# Patient Record
Sex: Male | Born: 1935 | Race: White | Hispanic: No | Marital: Married | Smoking: Never smoker
Health system: Southern US, Community
[De-identification: ages and names within clinical notes are randomized; demographics above are authoritative.]

---

## 2012-12-07 DIAGNOSIS — C4491 Basal cell carcinoma of skin, unspecified: Secondary | ICD-10-CM

## 2012-12-07 HISTORY — DX: Basal cell carcinoma of skin, unspecified: C44.91

## 2015-12-08 DIAGNOSIS — L570 Actinic keratosis: Secondary | ICD-10-CM

## 2015-12-08 HISTORY — DX: Actinic keratosis: L57.0

## 2018-04-25 ENCOUNTER — Other Ambulatory Visit: Payer: Self-pay | Admitting: Internal Medicine

## 2018-04-25 DIAGNOSIS — I714 Abdominal aortic aneurysm, without rupture, unspecified: Secondary | ICD-10-CM

## 2018-05-10 ENCOUNTER — Ambulatory Visit
Admission: RE | Admit: 2018-05-10 | Discharge: 2018-05-10 | Disposition: A | Discharge status: Home / Self Care | Payer: Medicare Other | Source: Ambulatory Visit | Attending: Internal Medicine | Admitting: Internal Medicine

## 2018-05-10 DIAGNOSIS — I714 Abdominal aortic aneurysm, without rupture, unspecified: Secondary | ICD-10-CM

## 2019-09-08 IMAGING — US US AORTA SCREENING (MEDICARE)
1 series · 14 of 20 positions shown · non-contrast
Comparison: None available

CLINICAL DATA: Abdominal aortic aneurysm demonstrated by x-ray

EXAM:
ULTRASOUND OF ABDOMINAL AORTA
TECHNIQUE: Ultrasound examination of the abdominal aorta was performed to
evaluate for abdominal aortic aneurysm.

[Series 1: us aorta screening (medicare) · 0.31mm/px · 14 of 20 slices shown]
[im 1/20]
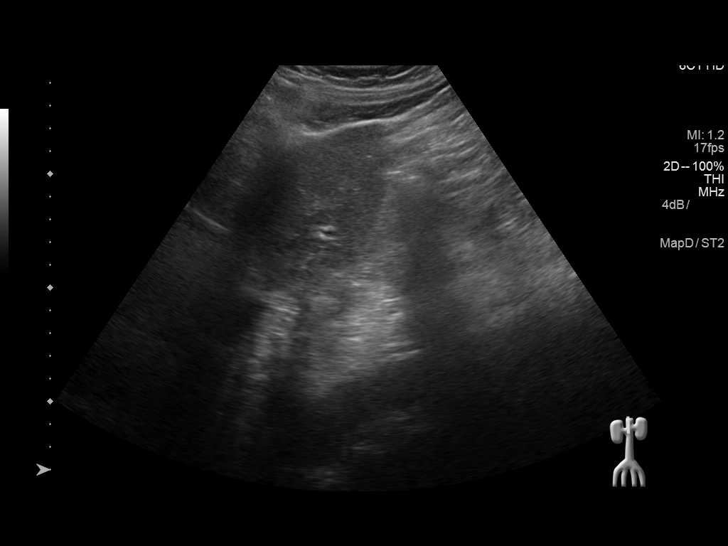
[im 3/20]
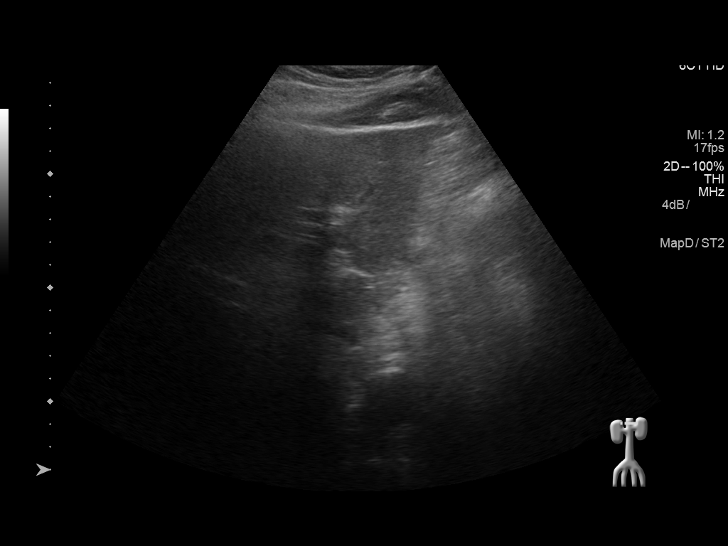
[im 4/20]
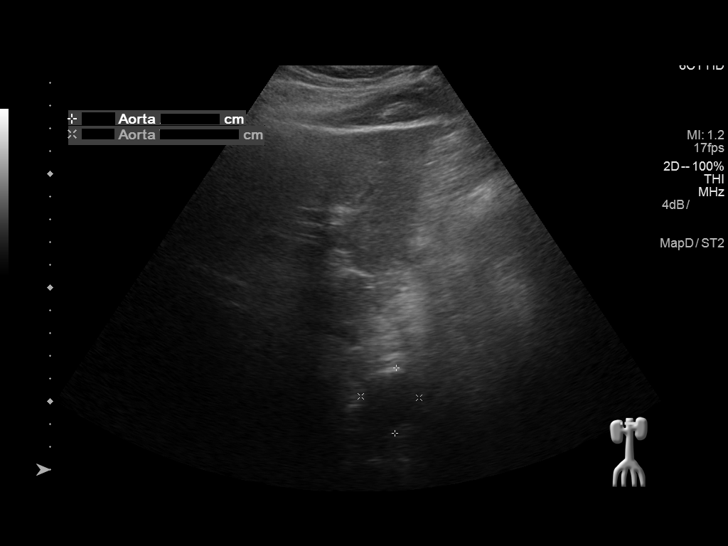
[im 6/20]
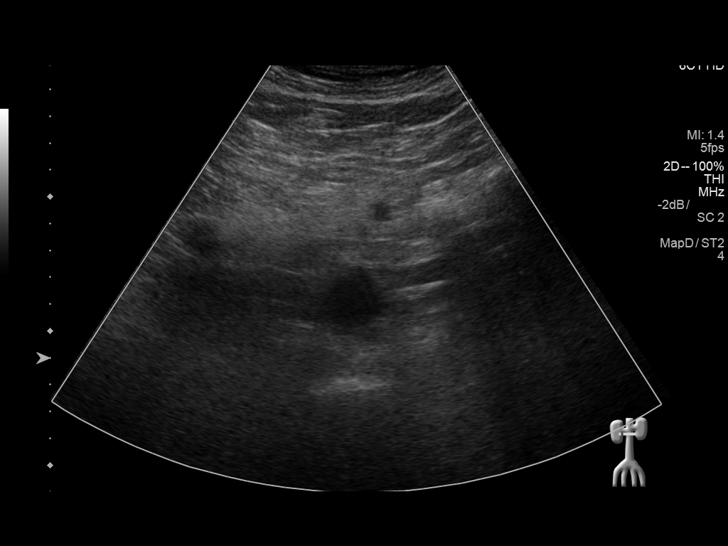
[im 7/20]
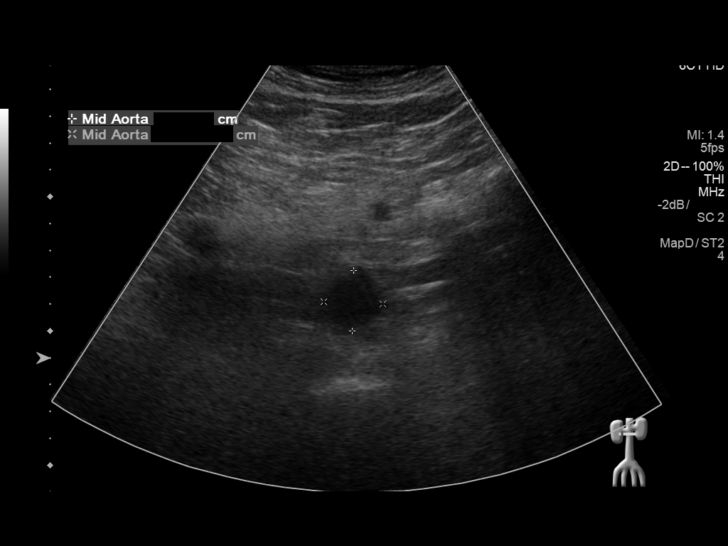
[im 8/20]
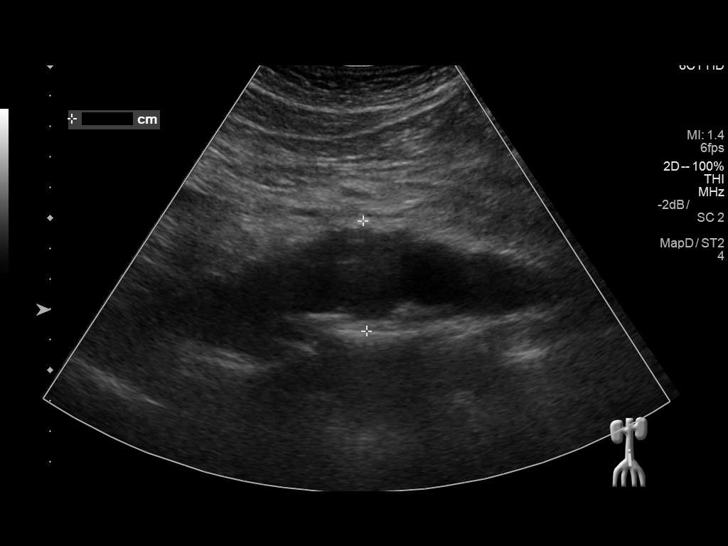
[im 10/20]
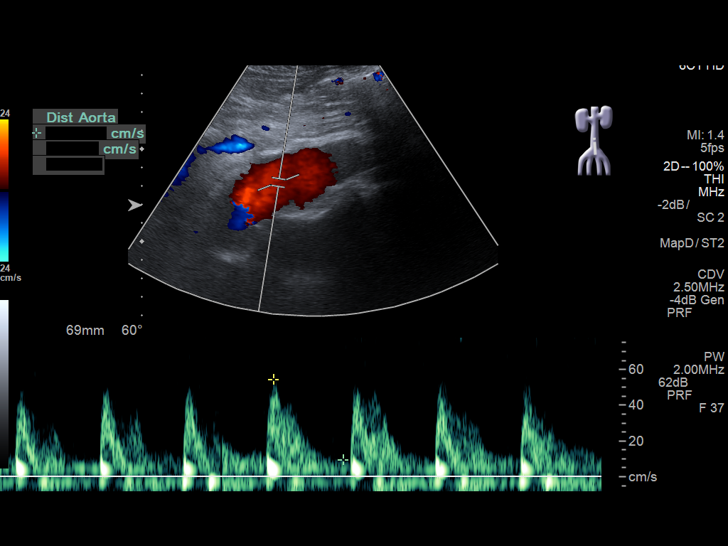
[im 11/20]
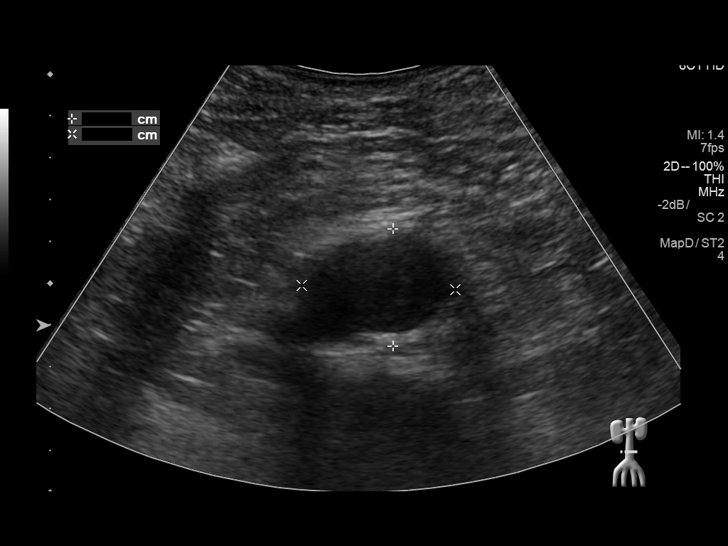
[im 13/20]
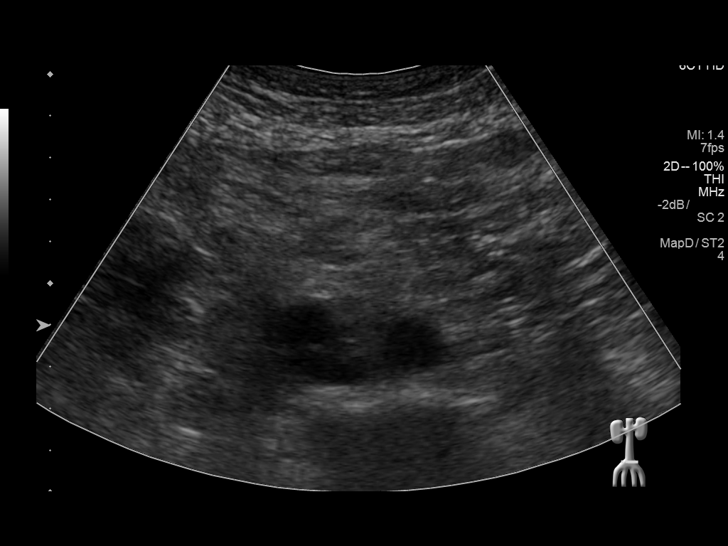
[im 14/20]
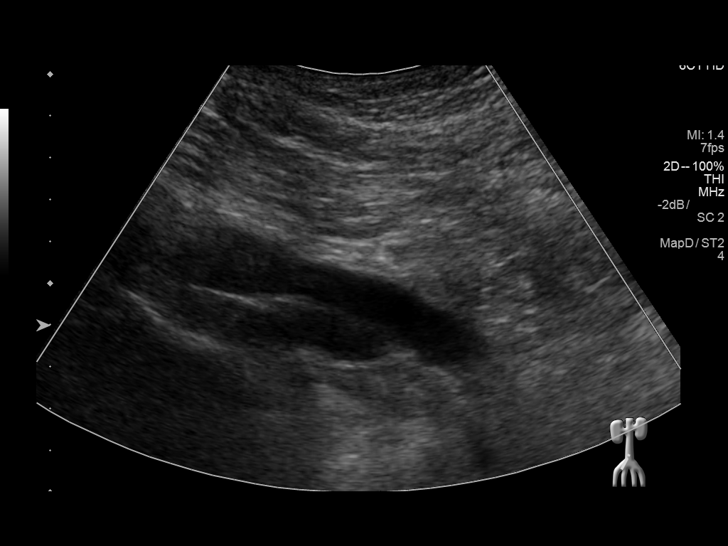
[im 16/20]
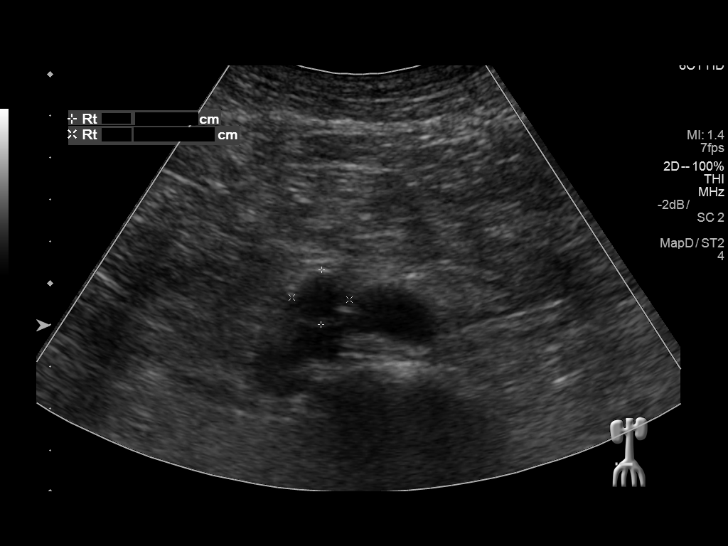
[im 17/20]
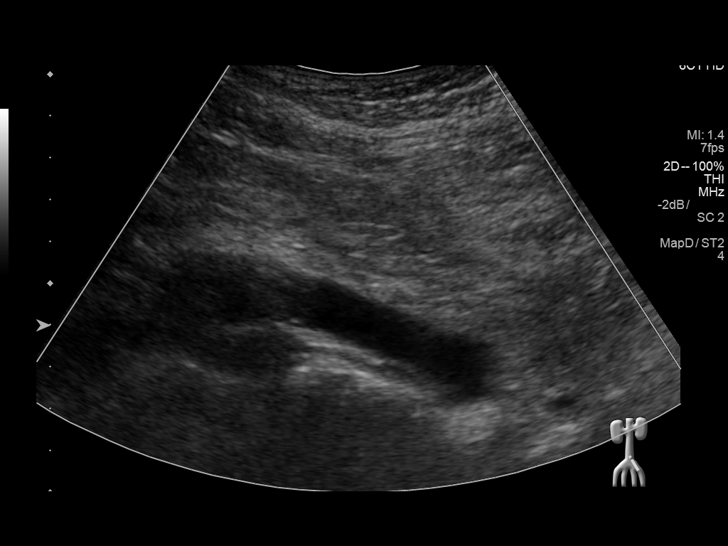
[im 18/20]
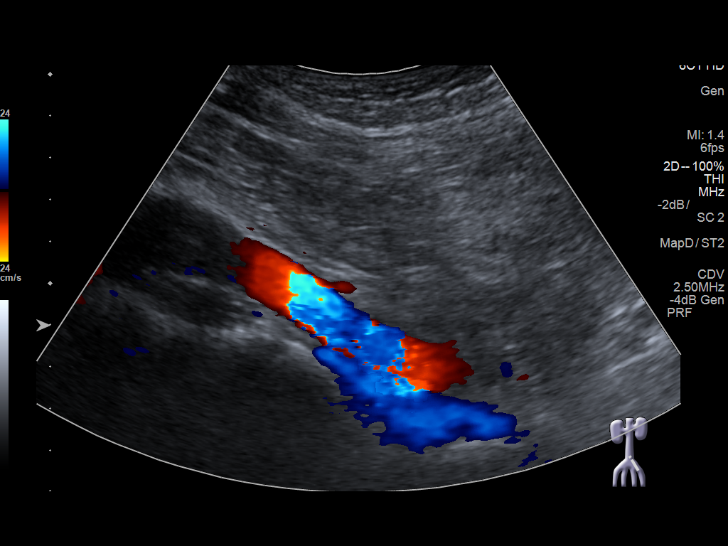
[im 20/20]
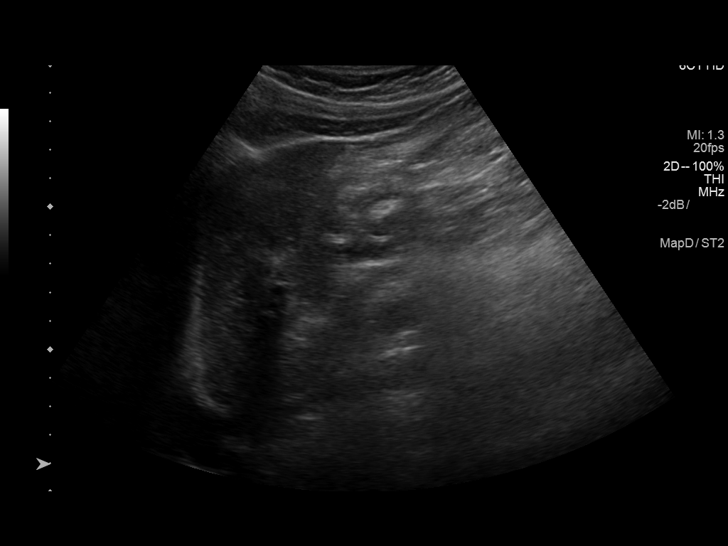

[14 of 20 positions shown; findings below may reference images not displayed]

FINDINGS: Abdominal aortic measurements as follows:

Proximal:  2.9 cm

Mid:  2.3 cm

Distal:  3.7 cm

Right iliac: 1.4 cm

Left iliac: 1.4 cm
IMPRESSION: 3.7 cm infrarenal distal aorta fusiform aneurysm

Recommend followup by ultrasound in 2 years. This recommendation
follows ACR consensus guidelines: White Paper of the ACR [employer]

## 2020-05-21 ENCOUNTER — Telehealth: Payer: Self-pay | Admitting: Radiology

## 2020-05-21 NOTE — Telephone Encounter (Signed)
Received the incorrect order on 3-4 for u/s abdominal aorta.  The office has been emailed 3x to fax the corrected order with no response.  The incorrect order has been purged from the main fax que.

## 2021-07-16 ENCOUNTER — Other Ambulatory Visit: Payer: Self-pay

## 2021-07-16 ENCOUNTER — Ambulatory Visit (INDEPENDENT_AMBULATORY_CARE_PROVIDER_SITE_OTHER): Payer: Medicare Other | Admitting: Dermatology

## 2021-07-16 DIAGNOSIS — L821 Other seborrheic keratosis: Secondary | ICD-10-CM | POA: Diagnosis not present

## 2021-07-16 DIAGNOSIS — L578 Other skin changes due to chronic exposure to nonionizing radiation: Secondary | ICD-10-CM

## 2021-07-16 DIAGNOSIS — Z1283 Encounter for screening for malignant neoplasm of skin: Secondary | ICD-10-CM | POA: Diagnosis not present

## 2021-07-16 DIAGNOSIS — L570 Actinic keratosis: Secondary | ICD-10-CM

## 2021-07-16 DIAGNOSIS — L820 Inflamed seborrheic keratosis: Secondary | ICD-10-CM

## 2021-07-16 DIAGNOSIS — L814 Other melanin hyperpigmentation: Secondary | ICD-10-CM

## 2021-07-16 DIAGNOSIS — D2339 Other benign neoplasm of skin of other parts of face: Secondary | ICD-10-CM | POA: Diagnosis not present

## 2021-07-16 DIAGNOSIS — D1800 Hemangioma unspecified site: Secondary | ICD-10-CM

## 2021-07-16 DIAGNOSIS — D485 Neoplasm of uncertain behavior of skin: Secondary | ICD-10-CM

## 2021-07-16 DIAGNOSIS — D229 Melanocytic nevi, unspecified: Secondary | ICD-10-CM

## 2021-07-16 DIAGNOSIS — D239 Other benign neoplasm of skin, unspecified: Secondary | ICD-10-CM

## 2021-07-16 HISTORY — DX: Other benign neoplasm of skin, unspecified: D23.9

## 2021-07-16 NOTE — Progress Notes (Signed)
Follow-Up Visit   Subjective  Cameron Norton is a 87 y.o. male who presents for the following: UBSE (Hx AK's and ISK's ). Lesion on the L cheek - irregular, has been there for many years. The patient presents for Upper Body Skin Exam (UBSE) for skin cancer screening and mole check.  The following portions of the chart were reviewed this encounter and updated as appropriate:   Allergies  Meds  Problems  Med Hx  Surg Hx  Fam Hx     Review of Systems:  No other skin or systemic complaints except as noted in HPI or Assessment and Plan.  Objective  Well appearing patient in no apparent distress; mood and affect are within normal limits.  A focused examination was performed including all skin from the waist up. Relevant physical exam findings are noted in the Assessment and Plan.   Assessment & Plan  AK (actinic keratosis) (17) scalp/face/ears x 17  Destruction of lesion - scalp/face/ears x 17 Complexity: simple   Destruction method: cryotherapy   Informed consent: discussed and consent obtained   Timeout:  patient name, date of birth, surgical site, and procedure verified Lesion destroyed using liquid nitrogen: Yes   Region frozen until ice ball extended beyond lesion: Yes   Outcome: patient tolerated procedure well with no complications   Post-procedure details: wound care instructions given    Inflamed seborrheic keratosis Scalp/trunk/extremities x 20  Destruction of lesion - Scalp/trunk/extremities x 20 Complexity: simple   Destruction method: cryotherapy   Informed consent: discussed and consent obtained   Timeout:  patient name, date of birth, surgical site, and procedure verified Lesion destroyed using liquid nitrogen: Yes   Region frozen until ice ball extended beyond lesion: Yes   Outcome: patient tolerated procedure well with no complications   Post-procedure details: wound care instructions given    Neoplasm of uncertain behavior of skin L  cheek  Epidermal / dermal shaving  Lesion diameter (cm):  0.8 Informed consent: discussed and consent obtained   Timeout: patient name, date of birth, surgical site, and procedure verified   Procedure prep:  Patient was prepped and draped in usual sterile fashion Prep type:  Isopropyl alcohol Anesthesia: the lesion was anesthetized in a standard fashion   Anesthetic:  1% lidocaine w/ epinephrine 1-100,000 buffered w/ 8.4% NaHCO3 Instrument used: flexible razor blade   Hemostasis achieved with: pressure, aluminum chloride and electrodesiccation   Outcome: patient tolerated procedure well   Post-procedure details: sterile dressing applied and wound care instructions given   Dressing type: bandage and petrolatum    Destruction of lesion Complexity: extensive   Destruction method: electrodesiccation and curettage   Informed consent: discussed and consent obtained   Timeout:  patient name, date of birth, surgical site, and procedure verified Procedure prep:  Patient was prepped and draped in usual sterile fashion Prep type:  Isopropyl alcohol Anesthesia: the lesion was anesthetized in a standard fashion   Anesthetic:  1% lidocaine w/ epinephrine 1-100,000 buffered w/ 8.4% NaHCO3 Curettage performed in three different directions: Yes   Electrodesiccation performed over the curetted area: Yes   Lesion length (cm):  0.8 Lesion width (cm):  0.8 Margin per side (cm):  0.2 Final wound size (cm):  1.2 Hemostasis achieved with:  pressure, aluminum chloride and electrodesiccation Outcome: patient tolerated procedure well with no complications   Post-procedure details: sterile dressing applied and wound care instructions given   Dressing type: bandage and petrolatum    Specimen 1 - Surgical pathology  Differential Diagnosis: D48.5 r/o BCC  ED&C today  Check Margins: No  Lentigines - Scattered tan macules - Due to sun exposure - Benign-appering, observe - Recommend daily broad spectrum  sunscreen SPF 30+ to sun-exposed areas, reapply every 2 hours as needed. - Call for any changes  Seborrheic Keratoses - Stuck-on, waxy, tan-brown papules and/or plaques  - Benign-appearing - Discussed benign etiology and prognosis. - Observe - Call for any changes  Melanocytic Nevi - Tan-brown and/or pink-flesh-colored symmetric macules and papules - Benign appearing on exam today - Observation - Call clinic for new or changing moles - Recommend daily use of broad spectrum spf 30+ sunscreen to sun-exposed areas.   Hemangiomas - Red papules - Discussed benign nature - Observe - Call for any changes  Actinic Damage - Chronic condition, secondary to cumulative UV/sun exposure - diffuse scaly erythematous macules with underlying dyspigmentation - Recommend daily broad spectrum sunscreen SPF 30+ to sun-exposed areas, reapply every 2 hours as needed.  - Staying in the shade or wearing long sleeves, sun glasses (UVA+UVB protection) and wide brim hats (4-inch brim around the entire circumference of the hat) are also recommended for sun protection.  - Call for new or changing lesions.  Return in about 6 months (around 01/16/2022) for AK follow up .  Luther Redo, CMA, am acting as scribe for Sarina Ser, MD . Documentation: I have reviewed the above documentation for accuracy and completeness, and I agree with the above.  Sarina Ser, MD

## 2021-07-16 NOTE — Patient Instructions (Addendum)
If you have any questions or concerns for your doctor, please call our main line at [phone] and press option 4 to reach your doctor's medical assistant. If no one answers, please leave a voicemail as directed and we will return your call as soon as possible. Messages left after 4 pm will be answered the following business day.   You may also send us a message via MyChart. We typically respond to MyChart messages within 1-2 business days.  For prescription refills, please ask your pharmacy to contact our office. Our fax number is [phone].  If you have an urgent issue when the clinic is closed that cannot wait until the next business day, you can page your doctor at the number below.    Please note that while we do our best to be available for urgent issues outside of office hours, we are not available 24/7.   If you have an urgent issue and are unable to reach us, you may choose to seek medical care at your doctor's office, retail clinic, urgent care center, or emergency room.  If you have a medical emergency, please immediately call 911 or go to the emergency department.  Pager Numbers  - Dr. Kowalski: [phone]  - Dr. Moye: [phone]  - Dr. Stewart: [phone]  In the event of inclement weather, please call our main line at [phone] for an update on the status of any delays or closures.  Dermatology Medication Tips: Please keep the boxes that topical medications come in in order to help keep track of the instructions about where and how to use these. Pharmacies typically print the medication instructions only on the boxes and not directly on the medication tubes.   If your medication is too expensive, please contact our office at [phone] option 4 or send us a message through MyChart.   We are unable to tell what your co-pay for medications will be in advance as this is different depending on your insurance coverage. However, we may be able to find a substitute  medication at lower cost or fill out paperwork to get insurance to cover a needed medication.   If a prior authorization is required to get your medication covered by your insurance company, please allow us 1-2 business days to complete this process.  Drug prices often vary depending on where the prescription is filled and some pharmacies may offer cheaper prices.  The website www.goodrx.com contains coupons for medications through different pharmacies. The prices here do not account for what the cost may be with help from insurance (it may be cheaper with your insurance), but the website can give you the price if you did not use any insurance.  - You can print the associated coupon and take it with your prescription to the pharmacy.  - You may also stop by our office during regular business hours and pick up a GoodRx coupon card.  - If you need your prescription sent electronically to a different pharmacy, notify our office through [place] MyChart or by phone at [phone] option 4.   Wound Care Instructions  Cleanse wound gently with soap and water once a day then pat dry with clean gauze. Apply a thing coat of Petrolatum (petroleum jelly, "Vaseline") over the wound (unless you have an allergy to this). We recommend that you use a new, sterile tube of Vaseline. Do not pick or remove scabs. Do not remove the yellow or white "healing tissue" from the base of the wound.  Cover the   wound with fresh, clean, nonstick gauze and secure with paper tape. You may use Band-Aids in place of gauze and tape if the would is small enough, but would recommend trimming much of the tape off as there is often too much. Sometimes Band-Aids can irritate the skin.  You should call the office for your biopsy report after 1 week if you have not already been contacted.  If you experience any problems, such as abnormal amounts of bleeding, swelling, significant bruising, significant pain, or evidence of infection,  please call the office immediately.  FOR ADULT SURGERY PATIENTS: If you need something for pain relief you may take 1 extra strength Tylenol (acetaminophen) AND 2 Ibuprofen (200mg each) together every 4 hours as needed for pain. (do not take these if you are allergic to them or if you have a reason you should not take them.) Typically, you may only need pain medication for 1 to 3 days.    

## 2021-07-22 ENCOUNTER — Telehealth: Payer: Self-pay

## 2021-07-22 ENCOUNTER — Encounter: Payer: Self-pay | Admitting: Dermatology

## 2021-07-22 NOTE — Telephone Encounter (Signed)
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

## 2021-07-22 NOTE — Telephone Encounter (Signed)
Discussed biopsy results with pt  °

## 2022-01-20 ENCOUNTER — Encounter: Payer: Self-pay | Admitting: Dermatology

## 2022-01-21 ENCOUNTER — Other Ambulatory Visit: Payer: Self-pay

## 2022-01-21 ENCOUNTER — Ambulatory Visit (INDEPENDENT_AMBULATORY_CARE_PROVIDER_SITE_OTHER): Payer: Medicare Other | Admitting: Dermatology

## 2022-01-21 DIAGNOSIS — L570 Actinic keratosis: Secondary | ICD-10-CM | POA: Diagnosis not present

## 2022-01-21 DIAGNOSIS — L821 Other seborrheic keratosis: Secondary | ICD-10-CM | POA: Diagnosis not present

## 2022-01-21 DIAGNOSIS — L820 Inflamed seborrheic keratosis: Secondary | ICD-10-CM | POA: Diagnosis not present

## 2022-01-21 DIAGNOSIS — L578 Other skin changes due to chronic exposure to nonionizing radiation: Secondary | ICD-10-CM | POA: Diagnosis not present

## 2022-01-21 MED ORDER — FLUOROURACIL 5 % EX CREA
TOPICAL_CREAM | Freq: Two times a day (BID) | CUTANEOUS | 1 refills | Status: DC
Start: 2022-01-21 — End: 2023-01-07

## 2022-01-21 NOTE — Progress Notes (Signed)
Follow-Up Visit   Subjective  Cameron Norton is a 88 y.o. male who presents for the following: Actinic Keratosis (Face, scalp, ears, 45m f/u). The patient has spots, moles and lesions to be evaluated, some may be new or changing and the patient has concerns that these could be cancer.  The following portions of the chart were reviewed this encounter and updated as appropriate:   Allergies   Meds   Problems   Med Hx   Surg Hx   Fam Hx      Review of Systems:  No other skin or systemic complaints except as noted in HPI or Assessment and Plan.  Objective  Well appearing patient in no apparent distress; mood and affect are within normal limits.  A focused examination was performed including face, scalp, ears, arms. Relevant physical exam findings are noted in the Assessment and Plan.  Scalp x 17 (17) Pink scaly macules  R tricep x 1 Stuck on waxy paps with erythema   Assessment & Plan   Actinic Damage - Severe, confluent actinic changes with pre-cancerous actinic keratoses  - Severe, chronic, not at goal, secondary to cumulative UV radiation exposure over time - diffuse scaly erythematous macules and papules with underlying dyspigmentation - Discussed Prescription "Field Treatment" for Severe, Chronic Confluent Actinic Changes with Pre-Cancerous Actinic Keratoses Field treatment involves treatment of an entire area of skin that has confluent Actinic Changes (Sun/ Ultraviolet light damage) and PreCancerous Actinic Keratoses by method of PhotoDynamic Therapy (PDT) and/or prescription Topical Chemotherapy agents such as 5-fluorouracil, 5-fluorouracil/calcipotriene, and/or imiquimod.  The purpose is to decrease the number of clinically evident and subclinical PreCancerous lesions to prevent progression to development of skin cancer by chemically destroying early precancer changes that may or may not be visible.  It has been shown to reduce the risk of developing skin cancer in the treated  area. As a result of treatment, redness, scaling, crusting, and open sores may occur during treatment course. One or more than one of these methods may be used and may have to be used several times to control, suppress and eliminate the PreCancerous changes. Discussed treatment course, expected reaction, and possible side effects. - Recommend daily broad spectrum sunscreen SPF 30+ to sun-exposed areas, reapply every 2 hours as needed.  - Staying in the shade or wearing long sleeves, sun glasses (UVA+UVB protection) and wide brim hats (4-inch brim around the entire circumference of the hat) are also recommended. - Call for new or changing lesions.  -In 1 month Start 5-fluorouracil/calcipotriene cream twice a day for 7 days to affected areas including scalp. Prescription sent to [facility]. Patient provided with contact information for pharmacy and advised the pharmacy will mail the prescription to their home. Patient provided with handout reviewing treatment course and side effects and advised to call or message [place] on MyChart with any concerns.   AK (actinic keratosis) (17) Scalp x 17 Destruction of lesion - Scalp x 17 Complexity: simple   Destruction method: cryotherapy   Informed consent: discussed and consent obtained   Timeout:  patient name, date of birth, surgical site, and procedure verified Lesion destroyed using liquid nitrogen: Yes   Region frozen until ice ball extended beyond lesion: Yes   Outcome: patient tolerated procedure well with no complications   Post-procedure details: wound care instructions given    fluorouracil (EFUDEX) 5 % cream - Scalp x 17 Apply topically 2 (two) times daily. Bid for 7 days to scalp  Inflamed seborrheic keratosis  R tricep x 1 Destruction of lesion - R tricep x 1 Complexity: simple   Destruction method: cryotherapy   Informed consent: discussed and consent obtained   Timeout:  patient name, date of birth, surgical site, and procedure  verified Lesion destroyed using liquid nitrogen: Yes   Region frozen until ice ball extended beyond lesion: Yes   Outcome: patient tolerated procedure well with no complications   Post-procedure details: wound care instructions given    Seborrheic Keratoses - Stuck-on, waxy, tan-brown papules and/or plaques  - Benign-appearing - Discussed benign etiology and prognosis. - Observe - Call for any changes  Return in about 6 months (around 07/21/2022) for UBSE, Hx of AKs.  I, [name], RMA, am acting as scribe for Sarina Ser, MD . Documentation: I have reviewed the above documentation for accuracy and completeness, and I agree with the above.  Sarina Ser, MD

## 2022-01-21 NOTE — Patient Instructions (Addendum)
In 1 month Start 5-fluorouracil/calcipotriene cream twice a day for 7 days to affected areas including scalp. Prescription sent to [facility].  5-Fluorouracil/Calcipotriene Patient Education   Actinic keratoses are the dry, red scaly spots on the skin caused by sun damage. A portion of these spots can turn into skin cancer with time, and treating them can help prevent development of skin cancer.   Treatment of these spots requires removal of the defective skin cells. There are various ways to remove actinic keratoses, including freezing with liquid nitrogen, treatment with creams, or treatment with a blue light procedure in the office.   5-fluorouracil cream is a topical cream used to treat actinic keratoses. It works by interfering with the growth of abnormal fast-growing skin cells, such as actinic keratoses. These cells peel off and are replaced by healthy ones.   5-fluorouracil/calcipotriene is a combination of the 5-fluorouracil cream with a vitamin D analog cream called calcipotriene. The calcipotriene alone does not treat actinic keratoses. However, when it is combined with 5-fluorouracil, it helps the 5-fluorouracil treat the actinic keratoses much faster so that the same results can be achieved with a much shorter treatment time.  INSTRUCTIONS FOR 5-FLUOROURACIL/CALCIPOTRIENE CREAM:   5-fluorouracil/calcipotriene cream typically only needs to be used for 4-7 days. A thin layer should be applied twice a day to the treatment areas recommended by your physician.   If your physician prescribed you separate tubes of 5-fluourouracil and calcipotriene, apply a thin layer of 5-fluorouracil followed by a thin layer of calcipotriene.   Avoid contact with your eyes, nostrils, and mouth. Do not use 5-fluorouracil/calcipotriene cream on infected or open wounds.   You will develop redness, irritation and some crusting at areas where you have pre-cancer damage/actinic keratoses. IF YOU DEVELOP  PAIN, BLEEDING, OR SIGNIFICANT CRUSTING, STOP THE TREATMENT EARLY - you have already gotten a good response and the actinic keratoses should clear up well.  Wash your hands after applying 5-fluorouracil 5% cream on your skin.   A moisturizer or sunscreen with a minimum SPF 30 should be applied each morning.   Once you have finished the treatment, you can apply a thin layer of Vaseline twice a day to irritated areas to soothe and calm the areas more quickly. If you experience significant discomfort, contact your physician.  For some patients it is necessary to repeat the treatment for best results.  SIDE EFFECTS: When using 5-fluorouracil/calcipotriene cream, you may have mild irritation, such as redness, dryness, swelling, or a mild burning sensation. This usually resolves within 2 weeks. The more actinic keratoses you have, the more redness and inflammation you can expect during treatment. Eye irritation has been reported rarely. If this occurs, please let us know.   If you have any trouble using this cream, please call the office. If you have any other questions about this information, please do not hesitate to ask me before you leave the office.       If You Need Anything After Your Visit  If you have any questions or concerns for your doctor, please call our main line at [phone] and press option 4 to reach your doctor's medical assistant. If no one answers, please leave a voicemail as directed and we will return your call as soon as possible. Messages left after 4 pm will be answered the following business day.   You may also send [place] a message via [place]. We typically respond to MyChart messages within 1-2 business days.  For prescription refills, please ask  your pharmacy to contact our office. Our fax number is [phone].  If you have an urgent issue when the clinic is closed that cannot wait until the next business day, you can page your doctor at the number below.     Please note that while we do our best to be available for urgent issues outside of office hours, we are not available 24/7.   If you have an urgent issue and are unable to reach [place], you may choose to seek medical care at your doctor's office, retail clinic, urgent care center, or emergency room.  If you have a medical emergency, please immediately call 911 or go to the emergency department.  Pager Numbers  - Dr. Nehemiah Massed: [phone]  - Dr. Laurence Ferrari: [phone]  - Dr. Nicole Kindred: [phone]  In the event of inclement weather, please call our main line at [phone] for an update on the status of any delays or closures.  Dermatology Medication Tips: Please keep the boxes that topical medications come in in order to help keep track of the instructions about where and how to use these. Pharmacies typically print the medication instructions only on the boxes and not directly on the medication tubes.   If your medication is too expensive, please contact our office at [phone] option 4 or send [place] a message through [place].   We are unable to tell what your co-pay for medications will be in advance as this is different depending on your insurance coverage. However, we may be able to find a substitute medication at lower cost or fill out paperwork to get insurance to cover a needed medication.   If a prior authorization is required to get your medication covered by your insurance company, please allow [place] 1-2 business days to complete this process.  Drug prices often vary depending on where the prescription is filled and some pharmacies may offer cheaper prices.  The website www.goodrx.com contains coupons for medications through different pharmacies. The prices here do not account for what the cost may be with help from insurance (it may be cheaper with your insurance), but the website can give you the price if you did not use any insurance.  - You can print the associated coupon and take  it with your prescription to the pharmacy.  - You may also stop by our office during regular business hours and pick up a GoodRx coupon card.  - If you need your prescription sent electronically to a different pharmacy, notify our office through [facility] or by phone at [phone] option 4.     Si Usted Necesita Algo Despus de Su Visita  Tambin puede enviarnos un mensaje a travs de Pharmacist, community. Por lo general respondemos a los mensajes de MyChart en el transcurso de 1 a 2 das hbiles.  Para renovar recetas, por favor pida a su farmacia que se ponga en contacto con nuestra oficina. Harland Dingwall de fax es [place] [phone].  Si tiene un asunto urgente cuando la clnica est cerrada y que no puede esperar hasta el siguiente da hbil, puede llamar/localizar a su doctor(a) al nmero que aparece a continuacin.   Por favor, tenga en cuenta que aunque hacemos todo lo posible para estar disponibles para asuntos urgentes fuera del horario de Primera, no estamos disponibles las 24 horas del da, los 7 das de la [place].   Si tiene un problema urgente y no puede comunicarse con nosotros, puede optar por buscar atencin mdica  en el consultorio de su doctor(a),  en una clnica privada, en un centro de atencin urgente o en una sala de emergencias.  Si tiene Engineering geologist, por favor llame inmediatamente al 911 o vaya a la sala de emergencias.  Nmeros de bper  - Dr. Nehemiah Massed: [phone]  - Dra. Moye: [phone]  - Dra. Nicole Kindred: [phone]  En caso de inclemencias del [place], por favor llame a Johnsie Kindred principal al [phone] para una actualizacin sobre el [place] de cualquier retraso o cierre.  Consejos para la medicacin en dermatologa: Por favor, guarde las cajas en las que vienen los medicamentos de uso tpico para ayudarle a seguir las instrucciones sobre dnde y cmo usarlos. Las farmacias generalmente imprimen las instrucciones del medicamento slo en las  cajas y no directamente en los tubos del [place].   Si su medicamento es muy caro, por favor, pngase en contacto con Zigmund Daniel llamando al [phone] y presione la opcin 4 o envenos un mensaje a travs de Pharmacist, community.   No podemos decirle cul ser su copago por los medicamentos por adelantado ya que esto es diferente dependiendo de la cobertura de su seguro. Sin embargo, es posible que podamos encontrar un medicamento sustituto a Electrical engineer un formulario para que el seguro cubra el medicamento que se considera necesario.   Si se requiere una autorizacin previa para que su compaa de seguros [place] su medicamento, por favor permtanos de 1 a 2 das hbiles para completar este proceso.  Los precios de los medicamentos varan con frecuencia dependiendo del Environmental consultant de dnde se surte la receta y alguna farmacias pueden ofrecer precios ms baratos.  El sitio web www.goodrx.com tiene cupones para medicamentos de Airline pilot. Los precios aqu no tienen en cuenta lo que podra costar con la ayuda del seguro (puede ser ms barato con su seguro), pero el sitio web puede darle el precio si no utiliz Research scientist (physical sciences).  - Puede imprimir el cupn correspondiente y llevarlo con su receta a la farmacia.  - Tambin puede pasar por nuestra oficina durante el horario de atencin regular y Charity fundraiser una tarjeta de cupones de GoodRx.  - Si necesita que su receta se enve electrnicamente a una farmacia diferente, informe a nuestra oficina a travs de MyChart de [place] o por telfono llamando al [phone] y presione la opcin 4.

## 2022-01-24 ENCOUNTER — Encounter: Payer: Self-pay | Admitting: Dermatology

## 2022-07-27 ENCOUNTER — Ambulatory Visit: Payer: Medicare Other | Admitting: Dermatology

## 2022-08-03 ENCOUNTER — Ambulatory Visit (INDEPENDENT_AMBULATORY_CARE_PROVIDER_SITE_OTHER): Payer: Medicare Other | Admitting: Dermatology

## 2022-08-03 DIAGNOSIS — Z1283 Encounter for screening for malignant neoplasm of skin: Secondary | ICD-10-CM

## 2022-08-03 DIAGNOSIS — D1800 Hemangioma unspecified site: Secondary | ICD-10-CM

## 2022-08-03 DIAGNOSIS — Z5111 Encounter for antineoplastic chemotherapy: Secondary | ICD-10-CM | POA: Diagnosis not present

## 2022-08-03 DIAGNOSIS — D229 Melanocytic nevi, unspecified: Secondary | ICD-10-CM

## 2022-08-03 DIAGNOSIS — L821 Other seborrheic keratosis: Secondary | ICD-10-CM

## 2022-08-03 DIAGNOSIS — L820 Inflamed seborrheic keratosis: Secondary | ICD-10-CM | POA: Diagnosis not present

## 2022-08-03 DIAGNOSIS — L578 Other skin changes due to chronic exposure to nonionizing radiation: Secondary | ICD-10-CM | POA: Diagnosis not present

## 2022-08-03 DIAGNOSIS — L814 Other melanin hyperpigmentation: Secondary | ICD-10-CM

## 2022-08-03 DIAGNOSIS — Z79899 Other long term (current) drug therapy: Secondary | ICD-10-CM | POA: Diagnosis not present

## 2022-08-03 DIAGNOSIS — L570 Actinic keratosis: Secondary | ICD-10-CM | POA: Diagnosis not present

## 2022-08-03 NOTE — Patient Instructions (Addendum)
Can restart Start 5-fluorouracil/calcipotriene cream twice a day for 7 days to affected areas including . Scalp      Actinic keratoses are precancerous spots that appear secondary to cumulative UV radiation exposure/sun exposure over time. They are chronic with expected duration over 1 year. A portion of actinic keratoses will progress to squamous cell carcinoma of the skin. It is not possible to reliably predict which spots will progress to skin cancer and so treatment is recommended to prevent development of skin cancer.  Recommend daily broad spectrum sunscreen SPF 30+ to sun-exposed areas, reapply every 2 hours as needed.  Recommend staying in the shade or wearing long sleeves, sun glasses (UVA+UVB protection) and wide brim hats (4-inch brim around the entire circumference of the hat). Call for new or changing lesions.   Cryotherapy Aftercare  Wash gently with soap and water everyday.   Apply Vaseline and Band-Aid daily until healed.     Seborrheic Keratosis  What causes seborrheic keratoses? Seborrheic keratoses are harmless, common skin growths that first appear during adult life.  As time goes by, more growths appear.  Some people may develop a large number of them.  Seborrheic keratoses appear on both covered and uncovered body parts.  They are not caused by sunlight.  The tendency to develop seborrheic keratoses can be inherited.  They vary in color from skin-colored to gray, brown, or even black.  They can be either smooth or have a rough, warty surface.   Seborrheic keratoses are superficial and look as if they were stuck on the skin.  Under the microscope this type of keratosis looks like layers upon layers of skin.  That is why at times the top layer may seem to fall off, but the rest of the growth remains and re-grows.    Treatment Seborrheic keratoses do not need to be treated, but can easily be removed in the office.  Seborrheic keratoses often cause symptoms when they rub on  clothing or jewelry.  Lesions can be in the way of shaving.  If they become inflamed, they can cause itching, soreness, or burning.  Removal of a seborrheic keratosis can be accomplished by freezing, burning, or surgery. If any spot bleeds, scabs, or grows rapidly, please return to have it checked, as these can be an indication of a skin cancer.     Melanoma ABCDEs  Melanoma is the most dangerous type of skin cancer, and is the leading cause of death from skin disease.  You are more likely to develop melanoma if you: Have light-colored skin, light-colored eyes, or red or blond hair Spend a lot of time in the sun Tan regularly, either outdoors or in a tanning bed Have had blistering sunburns, especially during childhood Have a close family member who has had a melanoma Have atypical moles or large birthmarks  Early detection of melanoma is key since treatment is typically straightforward and cure rates are extremely high if we catch it early.   The first sign of melanoma is often a change in a mole or a new dark spot.  The ABCDE system is a way of remembering the signs of melanoma.  A for asymmetry:  The two halves do not match. B for border:  The edges of the growth are irregular. C for color:  A mixture of colors are present instead of an even brown color. D for diameter:  Melanomas are usually (but not always) greater than 11m - the size of a pencil eraser. E for  evolution:  The spot keeps changing in size, shape, and color.  Please check your skin once per month between visits. You can use a small mirror in front and a large mirror behind you to keep an eye on the back side or your body.   If you see any new or changing lesions before your next follow-up, please call to schedule a visit.  Please continue daily skin protection including broad spectrum sunscreen SPF 30+ to sun-exposed areas, reapplying every 2 hours as needed when you're outdoors.   Staying in the shade or wearing  long sleeves, sun glasses (UVA+UVB protection) and wide brim hats (4-inch brim around the entire circumference of the hat) are also recommended for sun protection.    Due to recent changes in healthcare laws, you may see results of your pathology and/or laboratory studies on MyChart before the doctors have had a chance to review them. We understand that in some cases there may be results that are confusing or concerning to you. Please understand that not all results are received at the same time and often the doctors may need to interpret multiple results in order to provide you with the best plan of care or course of treatment. Therefore, we ask that you please give [place] 2 business days to thoroughly review all your results before contacting the office for clarification. Should we see a critical lab result, you will be contacted sooner.   If You Need Anything After Your Visit  If you have any questions or concerns for your doctor, please call our main line at [phone] and press option 4 to reach your doctor's medical assistant. If no one answers, please leave a voicemail as directed and we will return your call as soon as possible. Messages left after 4 pm will be answered the following business day.   You may also send [place] a message via [place]. We typically respond to MyChart messages within 1-2 business days.  For prescription refills, please ask your pharmacy to contact our office. Our fax number is [phone].  If you have an urgent issue when the clinic is closed that cannot wait until the next business day, you can page your doctor at the number below.    Please note that while we do our best to be available for urgent issues outside of office hours, we are not available 24/7.   If you have an urgent issue and are unable to reach [place], you may choose to seek medical care at your doctor's office, retail clinic, urgent care center, or emergency room.  If you have a medical emergency, please  immediately call 911 or go to the emergency department.  Pager Numbers  - Dr. Nehemiah Massed: [phone]  - Dr. Laurence Ferrari: [phone]  - Dr. Nicole Kindred: [phone]  In the event of inclement weather, please call our main line at [phone] for an update on the status of any delays or closures.  Dermatology Medication Tips: Please keep the boxes that topical medications come in in order to help keep track of the instructions about where and how to use these. Pharmacies typically print the medication instructions only on the boxes and not directly on the medication tubes.   If your medication is too expensive, please contact our office at [phone] option 4 or send [place] a message through [place].   We are unable to tell what your co-pay for medications will be in advance as this is different depending on your insurance coverage. However, we may be able  to find a substitute medication at lower cost or fill out paperwork to get insurance to cover a needed medication.   If a prior authorization is required to get your medication covered by your insurance company, please allow [place] 1-2 business days to complete this process.  Drug prices often vary depending on where the prescription is filled and some pharmacies may offer cheaper prices.  The website www.goodrx.com contains coupons for medications through different pharmacies. The prices here do not account for what the cost may be with help from insurance (it may be cheaper with your insurance), but the website can give you the price if you did not use any insurance.  - You can print the associated coupon and take it with your prescription to the pharmacy.  - You may also stop by our office during regular business hours and pick up a GoodRx coupon card.  - If you need your prescription sent electronically to a different pharmacy, notify our office through [facility] or by phone at [phone] option 4.     Si Usted Necesita Algo Despus  de Su Visita  Tambin puede enviarnos un mensaje a travs de Pharmacist, community. Por lo general respondemos a los mensajes de MyChart en el transcurso de 1 a 2 das hbiles.  Para renovar recetas, por favor pida a su farmacia que se ponga en contacto con nuestra oficina. Harland Dingwall de fax es [place] [phone].  Si tiene un asunto urgente cuando la clnica est cerrada y que no puede esperar hasta el siguiente da hbil, puede llamar/localizar a su doctor(a) al nmero que aparece a continuacin.   Por favor, tenga en cuenta que aunque hacemos todo lo posible para estar disponibles para asuntos urgentes fuera del horario de [place], no estamos disponibles las 24 horas del da, los 7 das de la [place].   Si tiene un problema urgente y no puede comunicarse con nosotros, puede optar por buscar atencin mdica  en el consultorio de su doctor(a), en una clnica privada, en un centro de atencin urgente o en una sala de emergencias.  Si tiene Engineering geologist, por favor llame inmediatamente al 911 o vaya a la sala de emergencias.  Nmeros de bper  - Dr. Nehemiah Massed: [phone]  - Dra. Moye: [phone]  - Dra. Nicole Kindred: [phone]  En caso de inclemencias del [place], por favor llame a Johnsie Kindred principal al [phone] para una actualizacin sobre el [place] de cualquier retraso o cierre.  Consejos para la medicacin en dermatologa: Por favor, guarde las cajas en las que vienen los medicamentos de uso tpico para ayudarle a seguir las instrucciones sobre dnde y cmo usarlos. Las farmacias generalmente imprimen las instrucciones del medicamento slo en las cajas y no directamente en los tubos del [place].   Si su medicamento es muy caro, por favor, pngase en contacto con Zigmund Daniel llamando al [phone] y presione la opcin 4 o envenos un mensaje a travs de Pharmacist, community.   No podemos decirle cul ser su copago por los medicamentos por adelantado ya que esto es diferente dependiendo  de la cobertura de su seguro. Sin embargo, es posible que podamos encontrar un medicamento sustituto a Electrical engineer un formulario para que el seguro cubra el medicamento que se considera necesario.   Si se requiere una autorizacin previa para que su compaa de seguros [place] su medicamento, por favor permtanos de 1 a 2 das hbiles para completar este proceso.  Los precios de los medicamentos varan con frecuencia  dependiendo del lugar de dnde se surte la receta y alguna farmacias pueden ofrecer precios ms baratos.  El sitio web www.goodrx.com tiene cupones para medicamentos de Airline pilot. Los precios aqu no tienen en cuenta lo que podra costar con la ayuda del seguro (puede ser ms barato con su seguro), pero el sitio web puede darle el precio si no utiliz Research scientist (physical sciences).  - Puede imprimir el cupn correspondiente y llevarlo con su receta a la farmacia.  - Tambin puede pasar por nuestra oficina durante el horario de atencin regular y Charity fundraiser una tarjeta de cupones de GoodRx.  - Si necesita que su receta se enve electrnicamente a una farmacia diferente, informe a nuestra oficina a travs de MyChart de [place] o por telfono llamando al [phone] y presione la opcin 4.

## 2022-08-03 NOTE — Progress Notes (Unsigned)
Follow-Up Visit   Subjective  Cameron Norton is a 88 y.o. male who presents for the following: Annual Exam (6 month upper body exam. Hx of aks at scalp used 5 f/u cream at scalp and got red and inflammed. ). The patient presents for Upper Body Skin Exam (UBSE) for skin cancer screening and mole check.  The patient has spots, moles and lesions to be evaluated, some may be new or changing and the patient has concerns that these could be cancer.  The following portions of the chart were reviewed this encounter and updated as appropriate:  Allergies  Meds  Problems  Med Hx  Surg Hx  Fam Hx     Review of Systems: No other skin or systemic complaints except as noted in HPI or Assessment and Plan.  Objective  Well appearing patient in no apparent distress; mood and affect are within normal limits.  All skin waist up examined.  scalp and face  x 6 (6) Erythematous thin papules/macules with gritty scale.   left cheek x 1 Erythematous stuck-on, waxy papule or plaque   Assessment & Plan  Actinic keratosis (6) scalp and face  x 6 Actinic keratoses are precancerous spots that appear secondary to cumulative UV radiation exposure/sun exposure over time. They are chronic with expected duration over 1 year. A portion of actinic keratoses will progress to squamous cell carcinoma of the skin. It is not possible to reliably predict which spots will progress to skin cancer and so treatment is recommended to prevent development of skin cancer.  Recommend daily broad spectrum sunscreen SPF 30+ to sun-exposed areas, reapply every 2 hours as needed.  Recommend staying in the shade or wearing long sleeves, sun glasses (UVA+UVB protection) and wide brim hats (4-inch brim around the entire circumference of the hat). Call for new or changing lesions.  Destruction of lesion - scalp and face  x 6 Complexity: simple   Destruction method: cryotherapy   Informed consent: discussed and consent obtained    Timeout:  patient name, date of birth, surgical site, and procedure verified Lesion destroyed using liquid nitrogen: Yes   Region frozen until ice ball extended beyond lesion: Yes   Outcome: patient tolerated procedure well with no complications   Post-procedure details: wound care instructions given   Additional details:  Prior to procedure, discussed risks of blister formation, small wound, skin dyspigmentation, or rare scar following cryotherapy. Recommend Vaseline ointment to treated areas while healing.  Inflamed seborrheic keratosis left cheek x 1 Symptomatic, irritating, patient would like treated. Destruction of lesion - left cheek x 1 Complexity: simple   Destruction method: cryotherapy   Informed consent: discussed and consent obtained   Timeout:  patient name, date of birth, surgical site, and procedure verified Lesion destroyed using liquid nitrogen: Yes   Region frozen until ice ball extended beyond lesion: Yes   Outcome: patient tolerated procedure well with no complications   Post-procedure details: wound care instructions given   Additional details:  Prior to procedure, discussed risks of blister formation, small wound, skin dyspigmentation, or rare scar following cryotherapy. Recommend Vaseline ointment to treated areas while healing.  Lentigines - Scattered tan macules - Due to sun exposure - Benign-appearing, observe - Recommend daily broad spectrum sunscreen SPF 30+ to sun-exposed areas, reapply every 2 hours as needed. - Call for any changes  Seborrheic Keratoses - Stuck-on, waxy, tan-brown papules and/or plaques  - Benign-appearing - Discussed benign etiology and prognosis. - Observe - Call for any  changes  Melanocytic Nevi - Tan-brown and/or pink-flesh-colored symmetric macules and papules - Benign appearing on exam today - Observation - Call clinic for new or changing moles - Recommend daily use of broad spectrum spf 30+ sunscreen to sun-exposed  areas.   Hemangiomas - Red papules - Discussed benign nature - Observe - Call for any changes  Actinic Damage - Severe, confluent actinic changes with pre-cancerous actinic keratoses  - Severe, chronic, not at goal, secondary to cumulative UV radiation exposure over time - diffuse scaly erythematous macules and papules with underlying dyspigmentation - Discussed Prescription "Field Treatment" for Severe, Chronic Confluent Actinic Changes with Pre-Cancerous Actinic Keratoses Field treatment involves treatment of an entire area of skin that has confluent Actinic Changes (Sun/ Ultraviolet light damage) and PreCancerous Actinic Keratoses by method of PhotoDynamic Therapy (PDT) and/or prescription Topical Chemotherapy agents such as 5-fluorouracil, 5-fluorouracil/calcipotriene, and/or imiquimod.  The purpose is to decrease the number of clinically evident and subclinical PreCancerous lesions to prevent progression to development of skin cancer by chemically destroying early precancer changes that may or may not be visible.  It has been shown to reduce the risk of developing skin cancer in the treated area. As a result of treatment, redness, scaling, crusting, and open sores may occur during treatment course. One or more than one of these methods may be used and may have to be used several times to control, suppress and eliminate the PreCancerous changes. Discussed treatment course, expected reaction, and possible side effects. - Recommend daily broad spectrum sunscreen SPF 30+ to sun-exposed areas, reapply every 2 hours as needed.  - Staying in the shade or wearing long sleeves, sun glasses (UVA+UVB protection) and wide brim hats (4-inch brim around the entire circumference of the hat) are also recommended. - Call for new or changing lesions.  Restart  5-fluorouracil/calcipotriene cream twice a day for 7 days to affected areas including crown of scalp.  Patient provided with handout reviewing treatment  course and side effects and advised to call or message [place] on MyChart with any concerns.  Skin cancer screening performed today. Return in about 6 months (around 02/03/2023) for TBSE. [name], CMA, am acting as scribe for Sarina Ser, MD. Documentation: I have reviewed the above documentation for accuracy and completeness, and I agree with the above.  Sarina Ser, MD

## 2022-08-04 ENCOUNTER — Encounter: Payer: Self-pay | Admitting: Dermatology

## 2022-12-11 ENCOUNTER — Other Ambulatory Visit: Payer: Self-pay | Admitting: Internal Medicine

## 2022-12-11 DIAGNOSIS — R1032 Left lower quadrant pain: Secondary | ICD-10-CM

## 2022-12-22 ENCOUNTER — Ambulatory Visit
Admission: RE | Admit: 2022-12-22 | Discharge: 2022-12-22 | Disposition: A | Discharge status: Home / Self Care | Payer: Medicare Other | Source: Ambulatory Visit | Attending: Internal Medicine | Admitting: Internal Medicine

## 2022-12-22 DIAGNOSIS — R1032 Left lower quadrant pain: Secondary | ICD-10-CM

## 2023-01-07 ENCOUNTER — Encounter (INDEPENDENT_AMBULATORY_CARE_PROVIDER_SITE_OTHER): Payer: Self-pay | Admitting: Nurse Practitioner

## 2023-01-07 ENCOUNTER — Ambulatory Visit (INDEPENDENT_AMBULATORY_CARE_PROVIDER_SITE_OTHER): Payer: Medicare Other | Admitting: Nurse Practitioner

## 2023-01-07 VITALS — Ht 69.0 in | Wt 179.0 lb

## 2023-01-07 DIAGNOSIS — I7143 Infrarenal abdominal aortic aneurysm, without rupture: Secondary | ICD-10-CM | POA: Diagnosis not present

## 2023-01-25 ENCOUNTER — Encounter (INDEPENDENT_AMBULATORY_CARE_PROVIDER_SITE_OTHER): Payer: Self-pay | Admitting: Nurse Practitioner

## 2023-01-25 NOTE — Progress Notes (Signed)
Subjective:    Patient ID: Cameron Norton, male    DOB: 08-04-1934, 89 y.o.   MRN: [id number] Chief Complaint  Patient presents with   New Patient (Initial Visit)    np. consult. Interval increase in size of an infrarenal abdominal aorta aneurysm measuring up to 4.5 x 4.5 cm extending approximately 6 cm  in the craniocaudal dimension. CT in epic from 1.16.24. referred by klein, bert.      The patient presents to the office for evaluation of an abdominal aortic aneurysm. The aneurysm was found incidentally by CT scan. Patient denies abdominal pain or unusual back pain, no other abdominal complaints.  No history of an abrupt onset of a painful toe associated with blue discoloration.      Patient denies amaurosis fugax or TIA symptoms. There is no history of claudication or rest pain symptoms of the lower extremities.  The patient denies angina or shortness of breath.  CT scan shows an AAA that measures 4.5 cm x 4.5 cm.  It was previously measured on 01/05/2022 by his cardiologist at 3.8 x 4.03 cm    Review of Systems  Gastrointestinal:  Negative for abdominal pain.  All other systems reviewed and are negative.      Objective:   Physical Exam Vitals reviewed.  HENT:     Head: Normocephalic.  Cardiovascular:     Rate and Rhythm: Normal rate.  Pulmonary:     Effort: Pulmonary effort is normal.  Skin:    General: Skin is warm and dry.  Neurological:     Mental Status: He is alert and oriented to person, place, and time.  Psychiatric:        Mood and Affect: Mood normal.        Behavior: Behavior normal.        Thought Content: Thought content normal.        Judgment: Judgment normal.     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    Social History   Socioeconomic History   Marital status: Married    Spouse name: Not  on file   Number of children: Not on file   Years of education: Not on file   Highest education level: Not on file  Occupational History   Not on file  Tobacco Use   Smoking status: Never    Passive exposure: Never   Smokeless tobacco: Never  Substance and Sexual Activity   Alcohol use: Never   Drug use: Never   Sexual activity: Not on file  Other Topics Concern   Not on file  Social History Narrative   Not on file   Social Determinants of Health   Financial Resource Strain: Not on file  Food Insecurity: Not on file  Transportation Needs: Not on file  Physical Activity: Not on file  Stress: Not on file  Social Connections: Not on file  Intimate Partner Violence: Not on file    History reviewed. No pertinent surgical history.  History reviewed. No pertinent family history.  No Known Allergies      No data to display            CMP  No results found for: "NA", "K", "CL", "CO2", "GLUCOSE", "BUN", "CREATININE", "CALCIUM", "PROT", "ALBUMIN", "AST", "ALT", "ALKPHOS", "BILITOT", "GFRNONAA", "GFRAA"  No results found.     Assessment & Plan:   1. Infrarenal abdominal aortic aneurysm (AAA) without rupture (HCC) The patient's most recent CT scan shows an abdominal aortic aneurysm measuring 4.5 cm which is an increase from the most recent note it measures of 3.8 x 4.03 cm in 2023.  Based on this increase we will plan on having the patient undergo a CT angiogram in 3 months to evaluate size as well as to possibly plan for procedure.  Continue with antiplatelet and statin - CT Angio Abd/Pel w/ and/or w/o; Future      Current Outpatient Medications on File Prior to Visit  Medication Sig Dispense Refill   aspirin 325 MG tablet Take 325 mg by mouth daily.     Cholecalciferol (VITAMIN D-3 PO) Take 1,000 Units by mouth daily at 6 (six) AM.     ezetimibe-simvastatin (VYTORIN) 10-20 MG tablet Take 1 tablet by mouth at bedtime.     fexofenadine (ALLEGRA) 180 MG tablet  Take by mouth.     meclizine (ANTIVERT) 12.5 MG tablet Take by mouth.     metoprolol tartrate (LOPRESSOR) 25 MG tablet Take by mouth.     nitroGLYCERIN (NITROSTAT) 0.4 MG SL tablet Place under the tongue.     omeprazole (PRILOSEC) 20 MG capsule Take 1 tablet by mouth daily.     No current facility-administered medications on file prior to visit.    There are no Patient Instructions on file for this visit. No follow-ups on file.   Kris Hartmann, NP

## 2023-02-08 ENCOUNTER — Ambulatory Visit (INDEPENDENT_AMBULATORY_CARE_PROVIDER_SITE_OTHER): Payer: Medicare Other | Admitting: Dermatology

## 2023-02-08 VITALS — BP 115/67 | HR 58

## 2023-02-08 DIAGNOSIS — L814 Other melanin hyperpigmentation: Secondary | ICD-10-CM

## 2023-02-08 DIAGNOSIS — L570 Actinic keratosis: Secondary | ICD-10-CM | POA: Diagnosis not present

## 2023-02-08 DIAGNOSIS — D229 Melanocytic nevi, unspecified: Secondary | ICD-10-CM | POA: Diagnosis not present

## 2023-02-08 DIAGNOSIS — L820 Inflamed seborrheic keratosis: Secondary | ICD-10-CM | POA: Diagnosis not present

## 2023-02-08 DIAGNOSIS — L578 Other skin changes due to chronic exposure to nonionizing radiation: Secondary | ICD-10-CM

## 2023-02-08 DIAGNOSIS — I8310 Varicose veins of unspecified lower extremity with inflammation: Secondary | ICD-10-CM

## 2023-02-08 DIAGNOSIS — Z1283 Encounter for screening for malignant neoplasm of skin: Secondary | ICD-10-CM | POA: Diagnosis not present

## 2023-02-08 DIAGNOSIS — Z86018 Personal history of other benign neoplasm: Secondary | ICD-10-CM

## 2023-02-08 NOTE — Patient Instructions (Addendum)
Actinic keratoses are precancerous spots that appear secondary to cumulative UV radiation exposure/sun exposure over time. They are chronic with expected duration over 1 year. A portion of actinic keratoses will progress to squamous cell carcinoma of the skin. It is not possible to reliably predict which spots will progress to skin cancer and so treatment is recommended to prevent development of skin cancer.  Recommend daily broad spectrum sunscreen SPF 30+ to sun-exposed areas, reapply every 2 hours as needed.  Recommend staying in the shade or wearing long sleeves, sun glasses (UVA+UVB protection) and wide brim hats (4-inch brim around the entire circumference of the hat). Call for new or changing lesions.   Cryotherapy Aftercare  Wash gently with soap and water everyday.   Apply Vaseline and Band-Aid daily until healed.    Seborrheic Keratosis  What causes seborrheic keratoses? Seborrheic keratoses are harmless, common skin growths that first appear during adult life.  As time goes by, more growths appear.  Some people may develop a large number of them.  Seborrheic keratoses appear on both covered and uncovered body parts.  They are not caused by sunlight.  The tendency to develop seborrheic keratoses can be inherited.  They vary in color from skin-colored to gray, brown, or even black.  They can be either smooth or have a rough, warty surface.   Seborrheic keratoses are superficial and look as if they were stuck on the skin.  Under the microscope this type of keratosis looks like layers upon layers of skin.  That is why at times the top layer may seem to fall off, but the rest of the growth remains and re-grows.    Treatment Seborrheic keratoses do not need to be treated, but can easily be removed in the office.  Seborrheic keratoses often cause symptoms when they rub on clothing or jewelry.  Lesions can be in the way of shaving.  If they become inflamed, they can cause itching, soreness, or  burning.  Removal of a seborrheic keratosis can be accomplished by freezing, burning, or surgery. If any spot bleeds, scabs, or grows rapidly, please return to have it checked, as these can be an indication of a skin cancer.  Melanoma ABCDEs  Melanoma is the most dangerous type of skin cancer, and is the leading cause of death from skin disease.  You are more likely to develop melanoma if you: Have light-colored skin, light-colored eyes, or red or blond hair Spend a lot of time in the sun Tan regularly, either outdoors or in a tanning bed Have had blistering sunburns, especially during childhood Have a close family member who has had a melanoma Have atypical moles or large birthmarks  Early detection of melanoma is key since treatment is typically straightforward and cure rates are extremely high if we catch it early.   The first sign of melanoma is often a change in a mole or a new dark spot.  The ABCDE system is a way of remembering the signs of melanoma.  A for asymmetry:  The two halves do not match. B for border:  The edges of the growth are irregular. C for color:  A mixture of colors are present instead of an even brown color. D for diameter:  Melanomas are usually (but not always) greater than 6mm - the size of a pencil eraser. E for evolution:  The spot keeps changing in size, shape, and color.  Please check your skin once per month between visits. You can use a small   mirror in front and a large mirror behind you to keep an eye on the back side or your body.   If you see any new or changing lesions before your next follow-up, please call to schedule a visit.  Please continue daily skin protection including broad spectrum sunscreen SPF 30+ to sun-exposed areas, reapplying every 2 hours as needed when you're outdoors.   Staying in the shade or wearing long sleeves, sun glasses (UVA+UVB protection) and wide brim hats (4-inch brim around the entire circumference of the hat) are also  recommended for sun protection.    Due to recent changes in healthcare laws, you may see results of your pathology and/or laboratory studies on MyChart before the doctors have had a chance to review them. We understand that in some cases there may be results that are confusing or concerning to you. Please understand that not all results are received at the same time and often the doctors may need to interpret multiple results in order to provide you with the best plan of care or course of treatment. Therefore, we ask that you please give us 2 business days to thoroughly review all your results before contacting the office for clarification. Should we see a critical lab result, you will be contacted sooner.   If You Need Anything After Your Visit  If you have any questions or concerns for your doctor, please call our main line at [phone] and press option 4 to reach your doctor's medical assistant. If no one answers, please leave a voicemail as directed and we will return your call as soon as possible. Messages left after 4 pm will be answered the following business day.   You may also send us a message via MyChart. We typically respond to MyChart messages within 1-2 business days.  For prescription refills, please ask your pharmacy to contact our office. Our fax number is [phone].  If you have an urgent issue when the clinic is closed that cannot wait until the next business day, you can page your doctor at the number below.    Please note that while we do our best to be available for urgent issues outside of office hours, we are not available 24/7.   If you have an urgent issue and are unable to reach us, you may choose to seek medical care at your doctor's office, retail clinic, urgent care center, or emergency room.  If you have a medical emergency, please immediately call 911 or go to the emergency department.  Pager Numbers  - Dr. Kowalski: [phone]  - Dr. Moye:  [phone]  - Dr. Stewart: [phone]  In the event of inclement weather, please call our main line at [phone] for an update on the status of any delays or closures.  Dermatology Medication Tips: Please keep the boxes that topical medications come in in order to help keep track of the instructions about where and how to use these. Pharmacies typically print the medication instructions only on the boxes and not directly on the medication tubes.   If your medication is too expensive, please contact our office at [phone] option 4 or send us a message through MyChart.   We are unable to tell what your co-pay for medications will be in advance as this is different depending on your insurance coverage. However, we may be able to find a substitute medication at lower cost or fill out paperwork to get insurance to cover a needed medication.   If a prior authorization   is required to get your medication covered by your insurance company, please allow us 1-2 business days to complete this process.  Drug prices often vary depending on where the prescription is filled and some pharmacies may offer cheaper prices.  The website www.goodrx.com contains coupons for medications through different pharmacies. The prices here do not account for what the cost may be with help from insurance (it may be cheaper with your insurance), but the website can give you the price if you did not use any insurance.  - You can print the associated coupon and take it with your prescription to the pharmacy.  - You may also stop by our office during regular business hours and pick up a GoodRx coupon card.  - If you need your prescription sent electronically to a different pharmacy, notify our office through [facility] MyChart or by phone at [phone] option 4.     Si Usted Necesita Algo Despus de Su Visita  Tambin puede enviarnos un mensaje a travs de MyChart. Por lo general respondemos a los mensajes de  MyChart en el transcurso de 1 a 2 das hbiles.  Para renovar recetas, por favor pida a su farmacia que se ponga en contacto con nuestra oficina. Nuestro nmero de fax es el [phone].  Si tiene un asunto urgente cuando la clnica est cerrada y que no puede esperar hasta el siguiente da hbil, puede llamar/localizar a su doctor(a) al nmero que aparece a continuacin.   Por favor, tenga en cuenta que aunque hacemos todo lo posible para estar disponibles para asuntos urgentes fuera del horario de oficina, no estamos disponibles las 24 horas del da, los 7 das de la semana.   Si tiene un problema urgente y no puede comunicarse con nosotros, puede optar por buscar atencin mdica  en el consultorio de su doctor(a), en una clnica privada, en un centro de atencin urgente o en una sala de emergencias.  Si tiene una emergencia mdica, por favor llame inmediatamente al 911 o vaya a la sala de emergencias.  Nmeros de bper  - Dr. Kowalski: [phone]  - Dra. Moye: [phone]  - Dra. Stewart: [phone]  En caso de inclemencias del tiempo, por favor llame a nuestra lnea principal al [phone] para una actualizacin sobre el estado de cualquier retraso o cierre.  Consejos para la medicacin en dermatologa: Por favor, guarde las cajas en las que vienen los medicamentos de uso tpico para ayudarle a seguir las instrucciones sobre dnde y cmo usarlos. Las farmacias generalmente imprimen las instrucciones del medicamento slo en las cajas y no directamente en los tubos del medicamento.   Si su medicamento es muy caro, por favor, pngase en contacto con nuestra oficina llamando al [phone] y presione la opcin 4 o envenos un mensaje a travs de MyChart.   No podemos decirle cul ser su copago por los medicamentos por adelantado ya que esto es diferente dependiendo de la cobertura de su seguro. Sin embargo, es posible que podamos encontrar un medicamento sustituto a menor costo o  llenar un formulario para que el seguro cubra el medicamento que se considera necesario.   Si se requiere una autorizacin previa para que su compaa de seguros cubra su medicamento, por favor permtanos de 1 a 2 das hbiles para completar este proceso.  Los precios de los medicamentos varan con frecuencia dependiendo del lugar de dnde se surte la receta y alguna farmacias pueden ofrecer precios ms baratos.  El sitio web www.goodrx.com tiene cupones para medicamentos   de diferentes farmacias. Los precios aqu no tienen en cuenta lo que podra costar con la ayuda del seguro (puede ser ms barato con su seguro), pero el sitio web puede darle el precio si no utiliz ningn seguro.  - Puede imprimir el cupn correspondiente y llevarlo con su receta a la farmacia.  - Tambin puede pasar por nuestra oficina durante el horario de atencin regular y recoger una tarjeta de cupones de GoodRx.  - Si necesita que su receta se enve electrnicamente a una farmacia diferente, informe a nuestra oficina a travs de MyChart de [facility] o por telfono llamando al [phone] y presione la opcin 4.  

## 2023-02-08 NOTE — Progress Notes (Signed)
Follow-Up Visit   Subjective  Cameron Norton is a 89 y.o. male who presents for the following: Annual Exam (6 mth tbse, Hx of aks, hx of isks, ). The patient presents for Total-Body Skin Exam (TBSE) for skin cancer screening and mole check.  The patient has spots, moles and lesions to be evaluated, some may be new or changing and the patient has concerns that these could be cancer.  The following portions of the chart were reviewed this encounter and updated as appropriate:  Tobacco  Allergies  Meds  Problems  Med Hx  Surg Hx  Fam Hx     Review of Systems: No other skin or systemic complaints except as noted in HPI or Assessment and Plan.  Objective  Well appearing patient in no apparent distress; mood and affect are within normal limits.  A full examination was performed including scalp, head, eyes, ears, nose, lips, neck, chest, axillae, abdomen, back, buttocks, bilateral upper extremities, bilateral lower extremities, hands, feet, fingers, toes, fingernails, and toenails. All findings within normal limits unless otherwise noted below.  face/scalp/ears x 15 (15) Erythematous thin papules/macules with gritty scale.   scalp / face x 2 (2) Erythematous stuck-on, waxy papule or plaque   Assessment & Plan  Actinic keratosis (15) face/scalp/ears x 15 Actinic keratoses are precancerous spots that appear secondary to cumulative UV radiation exposure/sun exposure over time. They are chronic with expected duration over 1 year. A portion of actinic keratoses will progress to squamous cell carcinoma of the skin. It is not possible to reliably predict which spots will progress to skin cancer and so treatment is recommended to prevent development of skin cancer.  Recommend daily broad spectrum sunscreen SPF 30+ to sun-exposed areas, reapply every 2 hours as needed.  Recommend staying in the shade or wearing long sleeves, sun glasses (UVA+UVB protection) and wide brim hats (4-inch brim  around the entire circumference of the hat). Call for new or changing lesions.  Destruction of lesion - face/scalp/ears x 15 Complexity: simple   Destruction method: cryotherapy   Informed consent: discussed and consent obtained   Timeout:  patient name, date of birth, surgical site, and procedure verified Lesion destroyed using liquid nitrogen: Yes   Region frozen until ice ball extended beyond lesion: Yes   Outcome: patient tolerated procedure well with no complications   Post-procedure details: wound care instructions given   Additional details:  Prior to procedure, discussed risks of blister formation, small wound, skin dyspigmentation, or rare scar following cryotherapy. Recommend Vaseline ointment to treated areas while healing.  Inflamed seborrheic keratosis (2) scalp / face x 2 Symptomatic, irritating, patient would like treated. Destruction of lesion - scalp / face x 2 Complexity: simple   Destruction method: cryotherapy   Informed consent: discussed and consent obtained   Timeout:  patient name, date of birth, surgical site, and procedure verified Lesion destroyed using liquid nitrogen: Yes   Region frozen until ice ball extended beyond lesion: Yes   Outcome: patient tolerated procedure well with no complications   Post-procedure details: wound care instructions given   Additional details:  Prior to procedure, discussed risks of blister formation, small wound, skin dyspigmentation, or rare scar following cryotherapy. Recommend Vaseline ointment to treated areas while healing.  Lentigines - Scattered tan macules - Due to sun exposure - Benign-appearing, observe - Recommend daily broad spectrum sunscreen SPF 30+ to sun-exposed areas, reapply every 2 hours as needed. - Call for any changes  Seborrheic Keratoses - Stuck-on,  waxy, tan-brown papules and/or plaques  - Benign-appearing - Discussed benign etiology and prognosis. - Observe - Call for any changes  Varicose  Veins/Spider Veins - Dilated blue, purple or red veins at the lower extremities - Reassured - Smaller vessels can be treated by sclerotherapy (a procedure to inject a medicine into the veins to make them disappear) if desired, but the treatment is not covered by insurance. Larger vessels may be covered if symptomatic and we would refer to vascular surgeon if treatment desired.  Melanocytic Nevi - Tan-brown and/or pink-flesh-colored symmetric macules and papules - Benign appearing on exam today - Observation - Call clinic for new or changing moles - Recommend daily use of broad spectrum spf 30+ sunscreen to sun-exposed areas.   Hemangiomas - Red papules - Discussed benign nature - Observe - Call for any changes  Actinic Damage - Chronic condition, secondary to cumulative UV/sun exposure - diffuse scaly erythematous macules with underlying dyspigmentation - Recommend daily broad spectrum sunscreen SPF 30+ to sun-exposed areas, reapply every 2 hours as needed.  - Staying in the shade or wearing long sleeves, sun glasses (UVA+UVB protection) and wide brim hats (4-inch brim around the entire circumference of the hat) are also recommended for sun protection.  - Call for new or changing lesions.  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 Return in about 6 months (around 08/11/2023) for TBSE. [name], CMA, am acting as scribe for Sarina Ser, MD. Documentation: I have reviewed the above documentation for accuracy and completeness, and I agree with the above.  Sarina Ser, MD

## 2023-02-13 ENCOUNTER — Encounter: Payer: Self-pay | Admitting: Dermatology

## 2023-03-30 ENCOUNTER — Ambulatory Visit
Admission: RE | Admit: 2023-03-30 | Discharge: 2023-03-30 | Disposition: A | Discharge status: Home / Self Care | Payer: Medicare Other | Source: Ambulatory Visit | Attending: Nurse Practitioner | Admitting: Nurse Practitioner

## 2023-03-30 DIAGNOSIS — I7143 Infrarenal abdominal aortic aneurysm, without rupture: Secondary | ICD-10-CM

## 2023-03-30 MED ORDER — IOHEXOL 350 MG/ML SOLN
100.0000 mL | Freq: Once | INTRAVENOUS | Status: AC | PRN
  Administered 2023-03-30: 100 mL via INTRAVENOUS

## 2023-04-11 DIAGNOSIS — I2510 Atherosclerotic heart disease of native coronary artery without angina pectoris: Secondary | ICD-10-CM | POA: Insufficient documentation

## 2023-04-11 DIAGNOSIS — E785 Hyperlipidemia, unspecified: Secondary | ICD-10-CM | POA: Insufficient documentation

## 2023-04-11 DIAGNOSIS — I10 Essential (primary) hypertension: Secondary | ICD-10-CM | POA: Insufficient documentation

## 2023-04-11 DIAGNOSIS — I714 Abdominal aortic aneurysm, without rupture, unspecified: Secondary | ICD-10-CM | POA: Insufficient documentation

## 2023-04-11 DIAGNOSIS — I70219 Atherosclerosis of native arteries of extremities with intermittent claudication, unspecified extremity: Secondary | ICD-10-CM | POA: Insufficient documentation

## 2023-04-11 NOTE — Progress Notes (Signed)
MRN : [id number]  Cameron Norton is a 89 y.o. (02-28-1934) male who presents with chief complaint of check circulation.  History of Present Illness:   The patient returns to the office for surveillance of a known abdominal aortic aneurysm. Patient denies abdominal pain or back pain, no other abdominal complaints. No changes suggesting embolic episodes.   There have been no interval changes in the patient's overall health care since his last visit.  Patient denies amaurosis fugax or TIA symptoms. There is no history of claudication or rest pain symptoms of the lower extremities. The patient denies angina or shortness of breath.   CT angio of the aorta and iliac arteries shows an AAA measured 4.75 cm.  No outpatient medications have been marked as taking for the 04/12/23 encounter (Appointment) with Gilda Crease, Latina Craver, MD.    Past Medical History:  Diagnosis Date   Actinic keratosis 2017   Sebaceous adenoma 07/16/2021   Left cheek. Base involved.    No past surgical history on file.  Social History Social History   Tobacco Use   Smoking status: Never    Passive exposure: Never   Smokeless tobacco: Never  Substance Use Topics   Alcohol use: Never   Drug use: Never    Family History No family history on file.  No Known Allergies   REVIEW OF SYSTEMS (Negative unless checked)  Constitutional: [] Weight loss  [] Fever  [] Chills Cardiac: [] Chest pain   [] Chest pressure   [] Palpitations   [] Shortness of breath when laying flat   [] Shortness of breath with exertion. Vascular:  [x] Pain in legs with walking   [] Pain in legs at rest  [] History of DVT   [] Phlebitis   [] Swelling in legs   [] Varicose veins   [] Non-healing ulcers Pulmonary:   [] Uses home oxygen   [] Productive cough   [] Hemoptysis   [] Wheeze  [] COPD   [] Asthma Neurologic:  [] Dizziness   [] Seizures   [] History of stroke   [] History of TIA  [] Aphasia   [] Vissual changes   [] Weakness or numbness in arm    [] Weakness or numbness in leg Musculoskeletal:   [] Joint swelling   [] Joint pain   [] Low back pain Hematologic:  [] Easy bruising  [] Easy bleeding   [] Hypercoagulable state   [] Anemic Gastrointestinal:  [] Diarrhea   [] Vomiting  [] Gastroesophageal reflux/heartburn   [] Difficulty swallowing. Genitourinary:  [] Chronic kidney disease   [] Difficult urination  [] Frequent urination   [] Blood in urine Skin:  [] Rashes   [] Ulcers  Psychological:  [] History of anxiety   []  History of major depression.  Physical Examination  There were no vitals filed for this visit. There is no height or weight on file to calculate BMI. Gen: WD/WN, NAD Head: [place]/AT, No temporalis wasting.  Ear/Nose/Throat: Hearing grossly intact, nares w/o erythema or drainage Eyes: PER, EOMI, sclera nonicteric.  Neck: Supple, no masses.  No bruit or JVD.  Pulmonary:  Good air movement, no audible wheezing, no use of accessory muscles.  Cardiac: RRR, normal S1, S2, no Murmurs. Vascular:  mild trophic changes, no open wounds Vessel Right Left  Radial Palpable Palpable  PT Not Palpable Not Palpable  DP Not Palpable Not Palpable  Gastrointestinal: soft, non-distended. No guarding/no peritoneal signs.  Musculoskeletal: M/S 5/5 throughout.  No visible deformity.  Neurologic: CN 2-12 intact. Pain and light touch intact in extremities.  Symmetrical.  Speech is fluent. Motor exam as listed above. Psychiatric: Judgment  intact, Mood & affect appropriate for pt's clinical situation. Dermatologic: No rashes or ulcers noted.  No changes consistent with cellulitis.   CBC No results found for: "WBC", "HGB", "HCT", "MCV", "PLT"  BMET No results found for: "NA", "K", "CL", "CO2", "GLUCOSE", "BUN", "CREATININE", "CALCIUM", "GFRNONAA", "GFRAA" CrCl cannot be calculated (No successful lab value found.).  COAG No results found for: "INR", "PROTIME"  Radiology CT Angio Abd/Pel w/ and/or w/o  Result Date: 03/31/2023 CLINICAL DATA:   Abdominal aortic aneurysm, preoperative planning EXAM: CTA ABDOMEN AND PELVIS WITHOUT AND WITH CONTRAST TECHNIQUE: Multidetector CT imaging of the abdomen and pelvis was performed using the standard protocol during bolus administration of intravenous contrast. Multiplanar reconstructed images and MIPs were obtained and reviewed to evaluate the vascular anatomy. RADIATION DOSE REDUCTION: This exam was performed according to the departmental dose-optimization program which includes automated exposure control, adjustment of the mA and/or kV according to patient size and/or use of iterative reconstruction technique. CONTRAST:  OMNIPAQUE IOHEXOL 350 MG/ML SOLN COMPARISON:  Prior CT scan of the abdomen and pelvis 12/22/2022 FINDINGS: VASCULAR Aorta: Bilobed fusiform infrarenal abdominal aortic aneurysm. Maximal diameter is 4.8 cm. Wall adherent mural thrombus is present throughout the aneurysmal segment. Celiac: Patent without evidence of aneurysm, dissection, vasculitis or significant stenosis. The lateral segmental branch of the left hepatic artery is replaced to the left gastric artery. SMA: Patent without evidence of aneurysm, dissection, vasculitis or significant stenosis. Renals: Solitary renal arteries bilaterally. No evidence of aneurysm. Ectasias of the left renal artery at the bifurcation measuring up to 0.9 cm. IMA: Patent without evidence of aneurysm, dissection, vasculitis or significant stenosis. Inflow: Patent without evidence of aneurysm, dissection, vasculitis or significant stenosis. Proximal Outflow: Bilateral common femoral and visualized portions of the superficial and profunda femoral arteries are patent without evidence of aneurysm, dissection, vasculitis or significant stenosis. Veins: No focal venous abnormality. Review of the MIP images confirms the above findings. NON-VASCULAR Lower chest: No acute abnormality. Hepatobiliary: No focal liver abnormality is seen. No gallstones, gallbladder  wall thickening, or biliary dilatation. Pancreas: Unremarkable. No pancreatic ductal dilatation or surrounding inflammatory changes. Spleen: Normal in size without focal abnormality. Adrenals/Urinary Tract: Normal adrenal glands. No hydronephrosis, nephrolithiasis or enhancing renal mass. Small subcentimeter simple cyst exophytic from the medial interpolar right kidney. No imaging follow-up is recommended. The ureters and bladder are unremarkable. Stomach/Bowel: Colonic diverticular disease without CT evidence of active inflammation. No evidence of obstruction or focal bowel wall thickening. Normal appendix in the right lower quadrant. The terminal ileum is unremarkable. Lymphatic: No suspicious lymphadenopathy. Reproductive: Prostate is unremarkable. Other: No abdominal wall hernia or abnormality. No abdominopelvic ascites. Musculoskeletal: No acute fracture or aggressive appearing lytic or blastic osseous lesion. Multilevel degenerative changes in the lower lumbar spine. IMPRESSION: VASCULAR 1. Bilobed fusiform infrarenal abdominal aortic aneurysm with a maximal diameter of 4.8 cm. Recommend follow-up every 6 months and vascular consultation. This recommendation follows ACR consensus guidelines: White Paper of the ACR Incidental Findings Committee II on Vascular Findings. J Am Coll Radiol 2013; 10:789-794. Aortic aneurysm NOS (ICD10-I71.9). 2. Atherosclerotic plaque.  Aortic Atherosclerosis (ICD10-I70.0). 3. Lateral segmental branch of the left hepatic artery is replaced to the left gastric artery. NON-VASCULAR 1. Colonic diverticular disease without CT evidence of active inflammation. 2. Multilevel degenerative changes in the lower lumbar spine. Signed, Sterling Big, MD, RPVI [facility] Radiology Electronically Signed   By: Malachy Moan M.D.   On: 03/31/2023 13:11     Assessment/Plan  1. Infrarenal abdominal aortic aneurysm (AAA) without rupture  (HCC) Recommend: No surgery or intervention is indicated at this time.  The patient has an asymptomatic abdominal aortic aneurysm that is greater than 4 cm but less than 5 cm in maximal diameter.    I have reviewed the natural history of abdominal aortic aneurysm and the small risk of rupture for aneurysm less than 5 cm in size.  However, as these small aneurysms tend to enlarge over time, continued surveillance with ultrasound or CT scan is mandatory.  In this case we discussed at length we will follow-up with a CT scan rather than ultrasound given that any further increasing growth will likely result in moving forward with stent graft placement and therefore a CT would be more appropriate  I have also discussed optimizing medical management with hypertension and lipid control and the importance of abstinence from tobacco.  The patient is also encouraged to exercise a minimum of 30 minutes 4 times a week.   Should the patient develop new onset abdominal or back pain or signs of peripheral embolization they are instructed to seek medical attention immediately and to alert the physician providing care that they have an aneurysm.   The patient voices their understanding.  I have scheduled the patient to return in 6 months with an aortic duplex.  2. Atherosclerosis of native artery of both lower extremities with intermittent claudication (HCC)  Recommend:  The patient has evidence of atherosclerosis of the lower extremities with claudication.  The patient does not voice lifestyle limiting changes at this point in time.  Noninvasive studies do not suggest clinically significant change.  No invasive studies, angiography or surgery at this time The patient should continue walking and begin a more formal exercise program.  The patient should continue antiplatelet therapy and aggressive treatment of the lipid abnormalities  No changes in the patient's medications at this time  Continued  surveillance is indicated as atherosclerosis is likely to progress with time.    The patient will continue follow up with noninvasive studies as ordered.   3. Coronary artery disease involving native coronary artery of native heart with angina pectoris (HCC) Continue cardiac and antihypertensive medications as already ordered and reviewed, no changes at this time.  Continue statin as ordered and reviewed, no changes at this time  Nitrates PRN for chest pain  4. Essential hypertension Continue antihypertensive medications as already ordered, these medications have been reviewed and there are no changes at this time.  5. Mixed hyperlipidemia Continue statin as ordered and reviewed, no changes at this time  6. Infrarenal abdominal aortic aneurysm (AAA) (HCC) See #1 - CT ANGIO ABDOMEN PELVIS  W & WO CONTRAST; Future   Levora Dredge, MD  04/11/2023 1:15 PM

## 2023-04-12 ENCOUNTER — Encounter (INDEPENDENT_AMBULATORY_CARE_PROVIDER_SITE_OTHER): Payer: Self-pay | Admitting: Vascular Surgery

## 2023-04-12 ENCOUNTER — Ambulatory Visit (INDEPENDENT_AMBULATORY_CARE_PROVIDER_SITE_OTHER): Payer: Medicare Other | Admitting: Vascular Surgery

## 2023-04-12 VITALS — BP 128/76 | HR 60 | Resp 18 | Ht 70.85 in | Wt 177.4 lb

## 2023-04-12 DIAGNOSIS — I25119 Atherosclerotic heart disease of native coronary artery with unspecified angina pectoris: Secondary | ICD-10-CM | POA: Diagnosis not present

## 2023-04-12 DIAGNOSIS — I70213 Atherosclerosis of native arteries of extremities with intermittent claudication, bilateral legs: Secondary | ICD-10-CM

## 2023-04-12 DIAGNOSIS — I7143 Infrarenal abdominal aortic aneurysm, without rupture: Secondary | ICD-10-CM

## 2023-04-12 DIAGNOSIS — I10 Essential (primary) hypertension: Secondary | ICD-10-CM | POA: Diagnosis not present

## 2023-04-12 DIAGNOSIS — I7133 Infrarenal abdominal aortic aneurysm, ruptured: Secondary | ICD-10-CM

## 2023-04-12 DIAGNOSIS — E782 Mixed hyperlipidemia: Secondary | ICD-10-CM

## 2023-04-18 ENCOUNTER — Encounter (INDEPENDENT_AMBULATORY_CARE_PROVIDER_SITE_OTHER): Payer: Self-pay | Admitting: Vascular Surgery

## 2023-04-30 ENCOUNTER — Telehealth (INDEPENDENT_AMBULATORY_CARE_PROVIDER_SITE_OTHER): Payer: Self-pay | Admitting: Vascular Surgery

## 2023-04-30 NOTE — Telephone Encounter (Signed)
Patient called in wanting Dr. Gilda Crease to call imaging and clarify if he is supposed to get CT done with or without contrast. Patient states that Dr told him he didn't need to have contrast for the CT but when scheduled they told him he was using contrast     Please call and advise

## 2023-04-30 NOTE — Telephone Encounter (Signed)
Dr. Gilda Crease wanted him to have imaging with contrast

## 2023-05-04 NOTE — Telephone Encounter (Signed)
Pt made aware

## 2023-07-27 ENCOUNTER — Ambulatory Visit (INDEPENDENT_AMBULATORY_CARE_PROVIDER_SITE_OTHER): Payer: Medicare Other | Admitting: Nurse Practitioner

## 2023-08-11 ENCOUNTER — Ambulatory Visit (INDEPENDENT_AMBULATORY_CARE_PROVIDER_SITE_OTHER): Payer: Medicare Other | Admitting: Dermatology

## 2023-08-11 ENCOUNTER — Encounter: Payer: Self-pay | Admitting: Dermatology

## 2023-08-11 DIAGNOSIS — C44319 Basal cell carcinoma of skin of other parts of face: Secondary | ICD-10-CM

## 2023-08-11 DIAGNOSIS — W908XXA Exposure to other nonionizing radiation, initial encounter: Secondary | ICD-10-CM | POA: Diagnosis not present

## 2023-08-11 DIAGNOSIS — D485 Neoplasm of uncertain behavior of skin: Secondary | ICD-10-CM

## 2023-08-11 DIAGNOSIS — Z85828 Personal history of other malignant neoplasm of skin: Secondary | ICD-10-CM

## 2023-08-11 DIAGNOSIS — L570 Actinic keratosis: Secondary | ICD-10-CM

## 2023-08-11 DIAGNOSIS — L821 Other seborrheic keratosis: Secondary | ICD-10-CM

## 2023-08-11 DIAGNOSIS — Z79899 Other long term (current) drug therapy: Secondary | ICD-10-CM

## 2023-08-11 DIAGNOSIS — Z1283 Encounter for screening for malignant neoplasm of skin: Secondary | ICD-10-CM

## 2023-08-11 DIAGNOSIS — Z7189 Other specified counseling: Secondary | ICD-10-CM

## 2023-08-11 DIAGNOSIS — Z5111 Encounter for antineoplastic chemotherapy: Secondary | ICD-10-CM

## 2023-08-11 DIAGNOSIS — L578 Other skin changes due to chronic exposure to nonionizing radiation: Secondary | ICD-10-CM | POA: Diagnosis not present

## 2023-08-11 DIAGNOSIS — Z86018 Personal history of other benign neoplasm: Secondary | ICD-10-CM

## 2023-08-11 DIAGNOSIS — L820 Inflamed seborrheic keratosis: Secondary | ICD-10-CM

## 2023-08-11 DIAGNOSIS — D229 Melanocytic nevi, unspecified: Secondary | ICD-10-CM

## 2023-08-11 DIAGNOSIS — L814 Other melanin hyperpigmentation: Secondary | ICD-10-CM | POA: Diagnosis not present

## 2023-08-11 DIAGNOSIS — D1801 Hemangioma of skin and subcutaneous tissue: Secondary | ICD-10-CM

## 2023-08-11 DIAGNOSIS — Z872 Personal history of diseases of the skin and subcutaneous tissue: Secondary | ICD-10-CM

## 2023-08-11 NOTE — Patient Instructions (Addendum)
Cryotherapy Aftercare  Wash gently with soap and water everyday.   Apply Vaseline and Band-Aid daily until healed.   - In 1 month - Start 5-fluorouracil/calcipotriene cream twice a day for 10 days to affected areas including scalp. Prescription sent to [facility]. Patient advised they will receive an email to purchase the medication online and have it sent to their home. Patient provided with handout reviewing treatment course and side effects and advised to call or message [place] on MyChart with any concerns.  Reviewed course of treatment and expected reaction.  Patient advised to expect inflammation and crusting and advised that erosions are possible.  Patient advised to be diligent with sun protection during and after treatment. Counseled to keep medication out of reach of children and pets.  Instructions for [facility]  One or more of your medications was sent to the [facility] mail order [facility] pharmacy. You will receive an email from them and can purchase the medicine through that link. It will then be mailed to your home at the address you confirmed. If for any reason you do not receive an email from them, please check your spam folder. If you still do not find the email, please let us know. [facility] phone number is [phone].   Wound Care Instructions  Cleanse wound gently with soap and water once a day then pat dry with clean gauze. Apply a thin coat of Petrolatum (petroleum jelly, "Vaseline") over the wound (unless you have an allergy to this). We recommend that you use a new, sterile tube of Vaseline. Do not pick or remove scabs. Do not remove the yellow or white "healing tissue" from the base of the wound.  Cover the wound with fresh, clean, nonstick gauze and secure with paper tape. You may use Band-Aids in place of gauze and tape if the wound is small enough, but would recommend trimming much of the tape off as there is often  too much. Sometimes Band-Aids can irritate the skin.  You should call the office for your biopsy report after 1 week if you have not already been contacted.  If you experience any problems, such as abnormal amounts of bleeding, swelling, significant bruising, significant pain, or evidence of infection, please call the office immediately.  FOR ADULT SURGERY PATIENTS: If you need something for pain relief you may take 1 extra strength Tylenol (acetaminophen) AND 2 Ibuprofen (200mg  each) together every 4 hours as needed for pain. (do not take these if you are allergic to them or if you have a reason you should not take them.) Typically, you may only need pain medication for 1 to 3 days.   Melanoma ABCDEs  Melanoma is the most dangerous type of skin cancer, and is the leading cause of death from skin disease.  You are more likely to develop melanoma if you: Have light-colored skin, light-colored eyes, or red or blond hair Spend a lot of time in the sun Tan regularly, either outdoors or in a tanning bed Have had blistering sunburns, especially during childhood Have a close family member who has had a melanoma Have atypical moles or large birthmarks  Early detection of melanoma is key since treatment is typically straightforward and cure rates are extremely high if we catch it early.   The first sign of melanoma is often a change in a mole or a new dark spot.  The ABCDE system is a way of remembering the signs of melanoma.  A for asymmetry:  The two halves do not match. B for border:  The edges of the growth are irregular. C for color:  A mixture of colors are present instead of an even brown color. D for diameter:  Melanomas are usually (but not always) greater than 6mm - the size of a pencil eraser. E for evolution:  The spot keeps changing in size, shape, and color.  Please check your skin once per month between visits. You can use a small mirror in front and a large mirror behind you to  keep an eye on the back side or your body.   If you see any new or changing lesions before your next follow-up, please call to schedule a visit.  Please continue daily skin protection including broad spectrum sunscreen SPF 30+ to sun-exposed areas, reapplying every 2 hours as needed when you're outdoors.    Due to recent changes in healthcare laws, you may see results of your pathology and/or laboratory studies on MyChart before the doctors have had a chance to review them. We understand that in some cases there may be results that are confusing or concerning to you. Please understand that not all results are received at the same time and often the doctors may need to interpret multiple results in order to provide you with the best plan of care or course of treatment. Therefore, we ask that you please give [place] 2 business days to thoroughly review all your results before contacting the office for clarification. Should we see a critical lab result, you will be contacted sooner.   If You Need Anything After Your Visit  If you have any questions or concerns for your doctor, please call our main line at [phone] and press option 4 to reach your doctor's medical assistant. If no one answers, please leave a voicemail as directed and we will return your call as soon as possible. Messages left after 4 pm will be answered the following business day.   You may also send [place] a message via MyChart. We typically respond to MyChart messages within 1-2 business days.  For prescription refills, please ask your pharmacy to contact our office. Our fax number is [phone].  If you have an urgent issue when the clinic is closed that cannot wait until the next business day, you can page your doctor at the number below.    Please note that while we do our best to be available for urgent issues outside of office hours, we are not available 24/7.   If you have an urgent issue and are unable to reach [place], you may  choose to seek medical care at your doctor's office, retail clinic, urgent care center, or emergency room.  If you have a medical emergency, please immediately call 911 or go to the emergency department.  Pager Numbers  - Dr. Gwen Pounds: [phone]  - Dr. Roseanne Reno: [phone]  - Dr. Katrinka Blazing: [phone]   In the event of inclement weather, please call our main line at [phone] for an update on the status of any delays or closures.  Dermatology Medication Tips: Please keep the boxes that topical medications come in in order to help keep track of the instructions about where and how to use these. Pharmacies typically print the medication instructions only on the boxes and not directly on the medication tubes.   If your medication is too expensive, please contact our office at [phone] option 4 or send [place] a message through MyChart.   We are unable to tell  what your co-pay for medications will be in advance as this is different depending on your insurance coverage. However, we may be able to find a substitute medication at lower cost or fill out paperwork to get insurance to cover a needed medication.   If a prior authorization is required to get your medication covered by your insurance company, please allow [place] 1-2 business days to complete this process.  Drug prices often vary depending on where the prescription is filled and some pharmacies may offer cheaper prices.  The website www.goodrx.com contains coupons for medications through different pharmacies. The prices here do not account for what the cost may be with help from insurance (it may be cheaper with your insurance), but the website can give you the price if you did not use any insurance.  - You can print the associated coupon and take it with your prescription to the pharmacy.  - You may also stop by our office during regular business hours and pick up a GoodRx coupon card.  - If you need your prescription sent  electronically to a different pharmacy, notify our office through [facility] or by phone at [phone] option 4.     Si Usted Necesita Algo Despus de Su Visita  Tambin puede enviarnos un mensaje a travs de Clinical cytogeneticist. Por lo general respondemos a los mensajes de MyChart en el transcurso de 1 a 2 das hbiles.  Para renovar recetas, por favor pida a su farmacia que se ponga en contacto con nuestra oficina. Annie Sable de fax es [place] [phone].  Si tiene un asunto urgente cuando la clnica est cerrada y que no puede esperar hasta el siguiente da hbil, puede llamar/localizar a su doctor(a) al nmero que aparece a continuacin.   Por favor, tenga en cuenta que aunque hacemos todo lo posible para estar disponibles para asuntos urgentes fuera del horario de [place], no estamos disponibles las 24 horas del da, los 7 [address]  [address] de la [place].   Si tiene un problema urgente y no puede comunicarse con nosotros, puede optar por buscar atencin mdica  en el consultorio de su doctor(a), en una clnica privada, en un centro de atencin urgente o en una sala de emergencias.  Si tiene Engineer, drilling, por favor llame inmediatamente al 911 o vaya a la sala de emergencias.  Nmeros de bper  - Dr. Gwen Pounds: [phone]  - Dra. Roseanne Reno: [phone]  - Dr. Katrinka Blazing: [phone]   En caso de inclemencias del tiempo, por favor llame a Lacy Duverney principal al [phone] para una actualizacin sobre el [place] de cualquier retraso o cierre.  Consejos para la medicacin en dermatologa: Por favor, guarde las cajas en las que vienen los medicamentos de uso tpico para ayudarle a seguir las instrucciones sobre dnde y cmo usarlos. Las farmacias generalmente imprimen las instrucciones del medicamento slo en las cajas y no directamente en los tubos del [place].   Si su medicamento es muy caro, por favor, pngase en contacto con Rolm Gala llamando al [phone] y presione la  opcin 4 o envenos un mensaje a travs de Clinical cytogeneticist.   No podemos decirle cul ser su copago por los medicamentos por adelantado ya que esto es diferente dependiendo de la cobertura de su seguro. Sin embargo, es posible que podamos encontrar un medicamento sustituto a Audiological scientist un formulario para que el seguro cubra el medicamento que se considera necesario.   Si se requiere una autorizacin previa para que su compaa de seguros [place]  su medicamento, por favor permtanos de 1 a 2 das hbiles para completar [address]  Los precios de los medicamentos varan con frecuencia dependiendo del Environmental consultant de dnde se surte la receta y alguna farmacias pueden ofrecer precios ms baratos.  El sitio web www.goodrx.com tiene cupones para medicamentos de Health and safety inspector. Los precios aqu no tienen en cuenta lo que podra costar con la ayuda del seguro (puede ser ms barato con su seguro), pero el sitio web puede darle el precio si no utiliz Tourist information centre manager.  - Puede imprimir el cupn correspondiente y llevarlo con su receta a la farmacia.  - Tambin puede pasar por nuestra oficina durante el horario de atencin regular y Education officer, museum una tarjeta de cupones de GoodRx.  - Si necesita que su receta se enve electrnicamente a una farmacia diferente, informe a nuestra oficina a travs de MyChart de [place] o por telfono llamando al [phone] y presione la opcin 4.

## 2023-08-11 NOTE — Progress Notes (Unsigned)
Follow-Up Visit   Subjective  Cameron Norton is a 89 y.o. male who presents for the following: Skin Cancer Screening and Full Body Skin Exam  The patient presents for Total-Body Skin Exam (TBSE) for skin cancer screening and mole check. The patient has spots, moles and lesions to be evaluated, some may be new or changing and the patient may have concern these could be cancer.  Spot at patient's left cheek that sometimes bleeds when he washes his face. Hx AK's.  The following portions of the chart were reviewed this encounter and updated as appropriate: medications, allergies, medical history  Review of Systems:  No other skin or systemic complaints except as noted in HPI or Assessment and Plan.  Objective  Well appearing patient in no apparent distress; mood and affect are within normal limits.  A full examination was performed including scalp, head, eyes, ears, nose, lips, neck, chest, axillae, abdomen, back, buttocks, bilateral upper extremities, bilateral lower extremities, hands, feet, fingers, toes, fingernails, and toenails. All findings within normal limits unless otherwise noted below.   Relevant physical exam findings are noted in the Assessment and Plan.  scalp x 15, face and ears x 6 (21) Erythematous thin papules/macules with gritty scale.   R tricep near elbow x 1, L mid dorsum forearm x 1, L hand x 1 (3) Erythematous stuck-on, waxy papule or plaque  left cheek Hyperkeratotic papule 1.2 cm          Assessment & Plan   SKIN CANCER SCREENING PERFORMED TODAY.  ACTINIC DAMAGE WITH PRECANCEROUS ACTINIC KERATOSES Counseling for Topical Chemotherapy Management: Patient exhibits: - Severe, confluent actinic changes with pre-cancerous actinic keratoses that is secondary to cumulative UV radiation exposure over time - Condition that is severe; chronic, not at goal. - diffuse scaly erythematous macules and papules with underlying dyspigmentation - Discussed  Prescription "Field Treatment" topical Chemotherapy for Severe, Chronic Confluent Actinic Changes with Pre-Cancerous Actinic Keratoses Field treatment involves treatment of an entire area of skin that has confluent Actinic Changes (Sun/ Ultraviolet light damage) and PreCancerous Actinic Keratoses by method of PhotoDynamic Therapy (PDT) and/or prescription Topical Chemotherapy agents such as 5-fluorouracil, 5-fluorouracil/calcipotriene, and/or imiquimod.  The purpose is to decrease the number of clinically evident and subclinical PreCancerous lesions to prevent progression to development of skin cancer by chemically destroying early precancer changes that may or may not be visible.  It has been shown to reduce the risk of developing skin cancer in the treated area. As a result of treatment, redness, scaling, crusting, and open sores may occur during treatment course. One or more than one of these methods may be used and may have to be used several times to control, suppress and eliminate the PreCancerous changes. Discussed treatment course, expected reaction, and possible side effects. - Recommend daily broad spectrum sunscreen SPF 30+ to sun-exposed areas, reapply every 2 hours as needed.  - Staying in the shade or wearing long sleeves, sun glasses (UVA+UVB protection) and wide brim hats (4-inch brim around the entire circumference of the hat) are also recommended. - Call for new or changing lesions. - In 1 month - Start 5-fluorouracil/calcipotriene cream twice a day for 10 days to affected areas including scalp. Prescription sent to [facility]. Patient advised they will receive an email to purchase the medication online and have it sent to their home. Patient provided with handout reviewing treatment course and side effects and advised to call or message [place] on MyChart with any concerns.  Reviewed course of treatment and expected reaction.  Patient advised to expect inflammation and  crusting and advised that erosions are possible.  Patient advised to be diligent with sun protection during and after treatment. Counseled to keep medication out of reach of children and pets.   LENTIGINES, SEBORRHEIC KERATOSES, HEMANGIOMAS - Benign normal skin lesions - Benign-appearing - Call for any changes  MELANOCYTIC NEVI - Tan-brown and/or pink-flesh-colored symmetric macules and papules - Benign appearing on exam today - Observation - Call clinic for new or changing moles - Recommend daily use of broad spectrum spf 30+ sunscreen to sun-exposed areas.   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 Adriana Simas - No evidence of recurrence today - Recommend regular full body skin exams - Recommend daily broad spectrum sunscreen SPF 30+ to sun-exposed areas, reapply every 2 hours as needed.  - Call if any new or changing lesions are noted between office visits   AK (actinic keratosis) (21) scalp x 15, face and ears x 6  Actinic keratoses are precancerous spots that appear secondary to cumulative UV radiation exposure/sun exposure over time. They are chronic with expected duration over 1 year. A portion of actinic keratoses will progress to squamous cell carcinoma of the skin. It is not possible to reliably predict which spots will progress to skin cancer and so treatment is recommended to prevent development of skin cancer.  Recommend daily broad spectrum sunscreen SPF 30+ to sun-exposed areas, reapply every 2 hours as needed.  Recommend staying in the shade or wearing long sleeves, sun glasses (UVA+UVB protection) and wide brim hats (4-inch brim around the entire circumference of the hat). Call for new or changing lesions.   Destruction of lesion - scalp x 15, face and ears x 6 (21)  Destruction method: cryotherapy   Informed consent: discussed and consent obtained   Lesion  destroyed using liquid nitrogen: Yes   Cryotherapy cycles:  2 Outcome: patient tolerated procedure well with no complications   Post-procedure details: wound care instructions given    Inflamed seborrheic keratosis (3) R tricep near elbow x 1, L mid dorsum forearm x 1, L hand x 1  Symptomatic, irritating, patient would like treated.  Benign-appearing.  Call clinic for new or changing lesions.    Destruction of lesion - R tricep near elbow x 1, L mid dorsum forearm x 1, L hand x 1 (3)  Destruction method: cryotherapy   Informed consent: discussed and consent obtained   Lesion destroyed using liquid nitrogen: Yes   Cryotherapy cycles:  2 Outcome: patient tolerated procedure well with no complications   Post-procedure details: wound care instructions given    Neoplasm of uncertain behavior of skin left cheek  Epidermal / dermal shaving  Lesion diameter (cm):  1.2 Informed consent: discussed and consent obtained   Patient was prepped and draped in usual sterile fashion: area prepped with alcohol. Anesthesia: the lesion was anesthetized in a standard fashion   Anesthetic:  1% lidocaine w/ epinephrine 1-100,000 buffered w/ 8.4% NaHCO3 Instrument used: flexible razor blade   Hemostasis achieved with: pressure, aluminum chloride and electrodesiccation   Outcome: patient tolerated procedure well   Post-procedure details: wound care instructions given   Post-procedure details comment:  Ointment and small bandage applied  Destruction of lesion  Destruction method: electrodesiccation and curettage   Informed consent: discussed and consent obtained  Timeout:  patient name, date of birth, surgical site, and procedure verified Anesthesia: the lesion was anesthetized in a standard fashion   Anesthetic:  1% lidocaine w/ epinephrine 1-100,000 buffered w/ 8.4% NaHCO3 Curettage performed in three different directions: Yes   Electrodesiccation performed over the curetted area: Yes    Curettage cycles:  3 Final wound size (cm):  1.2 Hemostasis achieved with:  electrodesiccation Outcome: patient tolerated procedure well with no complications   Post-procedure details: sterile dressing applied and wound care instructions given   Dressing type: petrolatum    Specimen 1 - Surgical pathology Differential Diagnosis: AK vs ISK vs SCC  Check Margins: No Hyperkeratotic papule 1.2 cm EDC  Skin cancer screening  Actinic skin damage  Lentigo  Melanocytic nevus, unspecified location  Chemotherapy management, encounter for  Counseling and coordination of care  Medication management  History of basal cell carcinoma   Return in about 6 months (around 02/08/2024) for with Dr. Kirtland Bouchard, Hx BCC, Hx AK, UBSE.  Anise Salvo, RMA, am acting as scribe for Armida Sans, MD .   Documentation: I have reviewed the above documentation for accuracy and completeness, and I agree with the above.  Armida Sans, MD

## 2023-08-12 ENCOUNTER — Encounter: Payer: Self-pay | Admitting: Dermatology

## 2023-08-17 ENCOUNTER — Telehealth: Payer: Self-pay

## 2023-08-17 NOTE — Telephone Encounter (Signed)
-----   Message from Armida Sans sent at 08/17/2023  9:07 AM EDT ----- Diagnosis Skin , left cheek BASAL CELL CARCINOMA, NODULAR PATTERN  Cancer = BCC Already treated Recheck next visit

## 2023-08-17 NOTE — Telephone Encounter (Signed)
Advised pt of bx results/sh ?

## 2023-10-05 ENCOUNTER — Ambulatory Visit
Admission: RE | Admit: 2023-10-05 | Discharge: 2023-10-05 | Disposition: A | Discharge status: Home / Self Care | Payer: Medicare Other | Source: Ambulatory Visit | Attending: Vascular Surgery | Admitting: Vascular Surgery

## 2023-10-05 DIAGNOSIS — Z01810 Encounter for preprocedural cardiovascular examination: Secondary | ICD-10-CM | POA: Insufficient documentation

## 2023-10-05 DIAGNOSIS — Z01818 Encounter for other preprocedural examination: Secondary | ICD-10-CM | POA: Diagnosis present

## 2023-10-05 DIAGNOSIS — I7133 Infrarenal abdominal aortic aneurysm, ruptured: Secondary | ICD-10-CM | POA: Diagnosis present

## 2023-10-05 DIAGNOSIS — K5730 Diverticulosis of large intestine without perforation or abscess without bleeding: Secondary | ICD-10-CM | POA: Insufficient documentation

## 2023-10-05 LAB — POCT I-STAT CREATININE: Creatinine, Ser: 0.8 mg/dL (ref 0.61–1.24)

## 2023-10-05 MED ORDER — IOHEXOL 350 MG/ML SOLN
100.0000 mL | Freq: Once | INTRAVENOUS | Status: AC | PRN
  Administered 2023-10-05: 100 mL via INTRAVENOUS

## 2023-10-13 NOTE — Progress Notes (Signed)
MRN : [id number]  Cameron Norton is a 89 y.o. (28-Jan-1934) male who presents with chief complaint of check circulation.  History of Present Illness:   The patient is seen for follow up evaluation of AAA status post CTA. There were no problems or complications related to the CT scan.   The patient denies interval development of abdominal or back pain. No new lower extremity pain or discoloration of the toes.   The patient denies interval anaurosis fugax. There is no recent history of TIA symptoms or focal motor deficits.   The patient denies PAD or claudication symptoms.   The patient denies recent episodes of angina or shortness of breath.  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  No significant change when compared to the duplex ultrasound of 6 months ago.  No outpatient medications have been marked as taking for the 10/14/23 encounter (Appointment) with Gilda Crease, Latina Craver, MD.    Past Medical History:  Diagnosis Date   Actinic keratosis 2017   BCC (basal cell carcinoma) 2014   left medial cheek, Mohs > 10 years ago with Dr. Adriana Simas   [facility] (basal cell carcinoma) 08/11/2023   L cheek, ED[facility]   Sebaceous adenoma 07/16/2021   Left cheek. Base involved.    No past surgical history on file.  Social History Social History   Tobacco Use   Smoking status: Never    Passive exposure: Never   Smokeless tobacco: Never  Substance Use Topics   Alcohol use: Never   Drug use: Never    Family History No family history on file.  No Known Allergies   REVIEW OF SYSTEMS (Negative unless checked)  Constitutional: [] Weight loss  [] Fever  [] Chills Cardiac: [] Chest pain   [] Chest pressure   [] Palpitations   [] Shortness of breath when laying flat   [] Shortness of breath with exertion. Vascular:  [x] Pain in legs with walking   [] Pain in legs at  rest  [] History of DVT   [] Phlebitis   [] Swelling in legs   [] Varicose veins   [] Non-healing ulcers Pulmonary:   [] Uses home oxygen   [] Productive cough   [] Hemoptysis   [] Wheeze  [] COPD   [] Asthma Neurologic:  [] Dizziness   [] Seizures   [] History of stroke   [] History of TIA  [] Aphasia   [] Vissual changes   [] Weakness or numbness in arm   [] Weakness or numbness in leg Musculoskeletal:   [] Joint swelling   [] Joint pain   [] Low back pain Hematologic:  [] Easy bruising  [] Easy bleeding   [] Hypercoagulable state   [] Anemic Gastrointestinal:  [] Diarrhea   [] Vomiting  [] Gastroesophageal reflux/heartburn   [] Difficulty swallowing. Genitourinary:  [] Chronic kidney disease   [] Difficult urination  [] Frequent urination   [] Blood in urine Skin:  [] Rashes   [] Ulcers  Psychological:  [] History of anxiety   []  History of major depression.  Physical Examination  There were no vitals filed for this visit. There is no height or weight on file to calculate BMI. Gen: WD/WN, NAD Head: /AT, No temporalis wasting.  Ear/Nose/Throat: Hearing grossly intact, nares w/o erythema or drainage Eyes: PER, EOMI, sclera nonicteric.  Neck: Supple, no masses.  No bruit or JVD.  Pulmonary:  Good air movement, no audible wheezing, no use of accessory muscles.  Cardiac: RRR, normal S1, S2, no Murmurs. Vascular:  mild trophic changes, no open wounds Vessel Right Left  Radial Palpable Palpable  PT Not Palpable Not Palpable  DP Not Palpable Not Palpable  Gastrointestinal: soft, non-distended. No guarding/no peritoneal signs.  Musculoskeletal: M/S 5/5 throughout.  No visible deformity.  Neurologic: CN 2-12 intact. Pain and light touch intact in extremities.  Symmetrical.  Speech is fluent. Motor exam as listed above. Psychiatric: Judgment intact, Mood & affect appropriate for pt's clinical situation. Dermatologic: No rashes or ulcers noted.  No changes consistent with cellulitis.   CBC No results found for: "WBC", "HGB",  "HCT", "MCV", "PLT"  BMET    Component Value Date/Time   CREATININE 0.80 10/05/2023 1007   CrCl cannot be calculated (Unknown ideal weight.).  COAG No results found for: "INR", "PROTIME"  Radiology CT ANGIO ABDOMEN PELVIS  W & WO CONTRAST  Result Date: 10/12/2023 CLINICAL DATA:  Abdominal aortic aneurysm (AAA), pre-op planning EXAM: CTA ABDOMEN AND PELVIS WITH CONTRAST TECHNIQUE: Multidetector CT imaging of the abdomen and pelvis was performed using the standard protocol during bolus administration of intravenous contrast. Multiplanar reconstructed images and MIPs were obtained and reviewed to evaluate the vascular anatomy. RADIATION DOSE REDUCTION: This exam was performed according to the departmental dose-optimization program which includes automated exposure control, adjustment of the mA and/or kV according to patient size and/or use of iterative reconstruction technique. CONTRAST:  OMNIPAQUE IOHEXOL 350 MG/ML SOLN COMPARISON:  CTA AP, 03/30/2023.  CT AP, 12/22/2022. FINDINGS: VASCULAR Aorta: Aortic atherosclerosis with bilobed fusiform infrarenal dilatation of the abdominal aorta containing mural thrombus, measuring approximately 4.6 x 4.6 x 9.0 cm (AP by transaxial by CC). No dissection, vasculitis or significant stenosis. Celiac: Patent without evidence of aneurysm, dissection, vasculitis or significant stenosis. Incidental replaced LEFT hepatic artery originating from the LEFT gastric artery. SMA: Patent without evidence of aneurysm, dissection, vasculitis or significant stenosis. Renals: Single renal arteries are present. Both renal arteries are patent without evidence of aneurysm, dissection, vasculitis, fibromuscular dysplasia or significant stenosis. IMA: Patent without evidence of aneurysm, dissection, vasculitis or significant stenosis. Pelvis: Patent without evidence of aneurysm, dissection, vasculitis or significant stenosis. Proximal Outflow: Bilateral common femoral and  visualized portions of the superficial and profunda femoral arteries are patent without evidence of aneurysm, dissection, vasculitis or significant stenosis. Veins: No obvious venous abnormality within the limitations of this arterial phase study. Review of the MIP images confirms the above findings. NON-VASCULAR Lower chest: No acute abnormality. Hepatobiliary: No focal liver abnormality. No gallstones, gallbladder wall thickening, or biliary dilatation. Pancreas: No pancreatic ductal dilatation or surrounding inflammatory changes. Spleen: Normal in size without focal abnormality. Adrenals/Urinary Tract: Adrenal glands are unremarkable. Sub-1 cm RIGHT superior renal pole exophytic hypodense lesion. No follow-up is recommended. Kidneys are otherwise normal, without renal calculi or hydronephrosis. Bladder is unremarkable. Stomach/Bowel: Small hiatus hernia. Stomach is otherwise within normal limits. Appendix surgically absent. Moderate-to-severe burden of sigmoid diverticulosis. No evidence of bowel wall thickening, distention, or inflammatory changes. Lymphatic: No enlarged abdominal or pelvic lymph nodes. Reproductive: Mild prostatomegaly, measuring 5.1 cm in transaxial dimension. Other: Small fat-containing LEFT inguinal hernia versus cord lipoma. No abdominopelvic ascites. Musculoskeletal: No acute osseous findings. L3 vertebral body hemangioma. IMPRESSION: VASCULAR 4.6 cm bilobed, fusiform infrarenal abdominal aortic  aneurysm. Aortic aneurysm NOS (ICD10-I71.9). Continue follow-up CTA as appropriate in 12 months and referral to or continued care with vascular specialist. (Ref.: J Vasc Surg. 2018; 67:2-77 and J Am Coll Radiol 2013;10(10):789-794.) NON-VASCULAR 1. No acute abdominopelvic process. 2. Colonic diverticulosis. Additional incidental, chronic and senescent findings as above. Roanna Banning, MD [facility] Radiology Electronically Signed   By: Roanna Banning  M.D.   On: 10/12/2023 12:35     Assessment/Plan 1. Infrarenal abdominal aortic aneurysm (AAA) without rupture (HCC) Recommend:  No surgery or intervention is indicated at this time.  The patient has an asymptomatic abdominal aortic aneurysm that is greater than 4 cm but less than 5 cm in maximal diameter.    I have reviewed the natural history of abdominal aortic aneurysm and the small risk of rupture for aneurysm less than 5 cm in size.  However, as these small aneurysms tend to enlarge over time, continued surveillance with ultrasound or CT scan is mandatory.   I have also discussed optimizing medical management with hypertension and lipid control and the negative effect that any tobacco products have on aneurysmal disease.  The patient is also encouraged to exercise a minimum of 30 minutes 4 times a week.   Should the patient develop new onset abdominal or back pain or signs of peripheral embolization they are instructed to seek medical attention immediately and to alert the physician providing care that they have an aneurysm.   The patient voices their understanding.  I have scheduled the patient to return in 6 months with an aortic duplex.  2. Atherosclerosis of native artery of both lower extremities with intermittent claudication (HCC)  Recommend:  The patient has evidence of atherosclerosis of the lower extremities with claudication.  The patient does not voice lifestyle limiting changes at this point in time.  Noninvasive studies do not suggest clinically significant change.  No invasive studies, angiography or surgery at this time The patient should continue walking and begin a more formal exercise program.  The patient should continue antiplatelet therapy and aggressive treatment of the lipid abnormalities  No changes in the patient's medications at this time  Continued surveillance is indicated as atherosclerosis is likely to progress with time.    The patient will  continue follow up with noninvasive studies as ordered.   3. Coronary artery disease involving native coronary artery of native heart with angina pectoris (HCC) Continue cardiac and antihypertensive medications as already ordered and reviewed, no changes at this time.  Continue statin as ordered and reviewed, no changes at this time  Nitrates PRN for chest pain  4. Essential hypertension Continue antihypertensive medications as already ordered, these medications have been reviewed and there are no changes at this time.  5. Mixed hyperlipidemia Continue statin as ordered and reviewed, no changes at this time    Levora Dredge, MD  10/13/2023 8:51 AM

## 2023-10-14 ENCOUNTER — Ambulatory Visit (INDEPENDENT_AMBULATORY_CARE_PROVIDER_SITE_OTHER): Payer: Medicare Other | Admitting: Vascular Surgery

## 2023-10-14 ENCOUNTER — Encounter (INDEPENDENT_AMBULATORY_CARE_PROVIDER_SITE_OTHER): Payer: Self-pay | Admitting: Vascular Surgery

## 2023-10-14 VITALS — BP 127/70 | HR 60 | Resp 16 | Wt 177.0 lb

## 2023-10-14 DIAGNOSIS — I7143 Infrarenal abdominal aortic aneurysm, without rupture: Secondary | ICD-10-CM | POA: Diagnosis not present

## 2023-10-14 DIAGNOSIS — I25119 Atherosclerotic heart disease of native coronary artery with unspecified angina pectoris: Secondary | ICD-10-CM | POA: Diagnosis not present

## 2023-10-14 DIAGNOSIS — I70213 Atherosclerosis of native arteries of extremities with intermittent claudication, bilateral legs: Secondary | ICD-10-CM | POA: Diagnosis not present

## 2023-10-14 DIAGNOSIS — I10 Essential (primary) hypertension: Secondary | ICD-10-CM | POA: Diagnosis not present

## 2023-10-14 DIAGNOSIS — E782 Mixed hyperlipidemia: Secondary | ICD-10-CM

## 2023-10-17 ENCOUNTER — Encounter (INDEPENDENT_AMBULATORY_CARE_PROVIDER_SITE_OTHER): Payer: Self-pay | Admitting: Vascular Surgery

## 2024-02-17 ENCOUNTER — Ambulatory Visit: Payer: Medicare Other | Admitting: Dermatology

## 2024-02-21 ENCOUNTER — Encounter: Payer: Self-pay | Admitting: Dermatology

## 2024-02-21 ENCOUNTER — Ambulatory Visit (INDEPENDENT_AMBULATORY_CARE_PROVIDER_SITE_OTHER): Admitting: Dermatology

## 2024-02-21 DIAGNOSIS — D229 Melanocytic nevi, unspecified: Secondary | ICD-10-CM

## 2024-02-21 DIAGNOSIS — L820 Inflamed seborrheic keratosis: Secondary | ICD-10-CM | POA: Diagnosis not present

## 2024-02-21 DIAGNOSIS — W908XXA Exposure to other nonionizing radiation, initial encounter: Secondary | ICD-10-CM | POA: Diagnosis not present

## 2024-02-21 DIAGNOSIS — Z5111 Encounter for antineoplastic chemotherapy: Secondary | ICD-10-CM

## 2024-02-21 DIAGNOSIS — Z7189 Other specified counseling: Secondary | ICD-10-CM

## 2024-02-21 DIAGNOSIS — L570 Actinic keratosis: Secondary | ICD-10-CM

## 2024-02-21 DIAGNOSIS — L821 Other seborrheic keratosis: Secondary | ICD-10-CM

## 2024-02-21 DIAGNOSIS — L578 Other skin changes due to chronic exposure to nonionizing radiation: Secondary | ICD-10-CM | POA: Diagnosis not present

## 2024-02-21 DIAGNOSIS — Z1283 Encounter for screening for malignant neoplasm of skin: Secondary | ICD-10-CM | POA: Diagnosis not present

## 2024-02-21 DIAGNOSIS — D1801 Hemangioma of skin and subcutaneous tissue: Secondary | ICD-10-CM

## 2024-02-21 DIAGNOSIS — Z79899 Other long term (current) drug therapy: Secondary | ICD-10-CM

## 2024-02-21 DIAGNOSIS — Z85828 Personal history of other malignant neoplasm of skin: Secondary | ICD-10-CM

## 2024-02-21 DIAGNOSIS — L814 Other melanin hyperpigmentation: Secondary | ICD-10-CM | POA: Diagnosis not present

## 2024-02-21 NOTE — Progress Notes (Signed)
 Follow-Up Visit   Subjective  Cameron Norton is a 89 y.o. male who presents for the following: Skin Cancer Screening and Upper Body Skin Exam, hx of BCC. Patient treated his scalp with 5FU/Calcipotriene cream several months ago.   The patient presents for Upper Body Skin Exam (UBSE) for skin cancer screening and mole check. The patient has spots, moles and lesions to be evaluated, some may be new or changing and the patient may have concern these could be cancer.  The following portions of the chart were reviewed this encounter and updated as appropriate: medications, allergies, medical history  Review of Systems:  No other skin or systemic complaints except as noted in HPI or Assessment and Plan.  Objective  Well appearing patient in no apparent distress; mood and affect are within normal limits.  All skin waist up examined. Relevant physical exam findings are noted in the Assessment and Plan.  scalp x 17, left ear x 2 (19) Erythematous thin papules/macules with gritty scale.  left hand base of thumb x1, right hand base of thumb x 1, hands x 3 (5) Stuck-on, waxy, tan-brown papules and plaques -- Discussed benign etiology and prognosis.   Assessment & Plan   AK (ACTINIC KERATOSIS) (19) scalp x 17, left ear x 2 (19) ACTINIC DAMAGE WITH PRECANCEROUS ACTINIC KERATOSES Counseling for Topical Chemotherapy Management: Patient exhibits: - Severe, confluent actinic changes with pre-cancerous actinic keratoses that is secondary to cumulative UV radiation exposure over time - Condition that is severe; chronic, not at goal. - diffuse scaly erythematous macules and papules with underlying dyspigmentation - Discussed Prescription "Field Treatment" topical Chemotherapy for Severe, Chronic Confluent Actinic Changes with Pre-Cancerous Actinic Keratoses Field treatment involves treatment of an entire area of skin that has confluent Actinic Changes (Sun/ Ultraviolet light damage) and PreCancerous  Actinic Keratoses by method of PhotoDynamic Therapy (PDT) and/or prescription Topical Chemotherapy agents such as 5-fluorouracil, 5-fluorouracil/calcipotriene, and/or imiquimod.  The purpose is to decrease the number of clinically evident and subclinical PreCancerous lesions to prevent progression to development of skin cancer by chemically destroying early precancer changes that may or may not be visible.  It has been shown to reduce the risk of developing skin cancer in the treated area. As a result of treatment, redness, scaling, crusting, and open sores may occur during treatment course. One or more than one of these methods may be used and may have to be used several times to control, suppress and eliminate the PreCancerous changes. Discussed treatment course, expected reaction, and possible side effects. - Recommend daily broad spectrum sunscreen SPF 30+ to sun-exposed areas, reapply every 2 hours as needed.  - Staying in the shade or wearing long sleeves, sun glasses (UVA+UVB protection) and wide brim hats (4-inch brim around the entire circumference of the hat) are also recommended. - Call for new or changing lesions.  April 17 Restart 5FU/Calcipotriene cream apply to scalp twice a day x 10 days  Destruction of lesion - scalp x 17, left ear x 2 (19) Complexity: simple   Destruction method: cryotherapy   Informed consent: discussed and consent obtained   Timeout:  patient name, date of birth, surgical site, and procedure verified Lesion destroyed using liquid nitrogen: Yes   Region frozen until ice ball extended beyond lesion: Yes   Outcome: patient tolerated procedure well with no complications   Post-procedure details: wound care instructions given   INFLAMED SEBORRHEIC KERATOSIS (5) left hand base of thumb x1, right hand base of thumb x  1, hands x 3 (5) Symptomatic, irritating, patient would like treated.   If not gone on the left base of thumb at next visit we may consider biopsy   Destruction of lesion - left hand base of thumb x1, right hand base of thumb x 1, hands x 3 (5) Complexity: simple   Destruction method: cryotherapy   Informed consent: discussed and consent obtained   Timeout:  patient name, date of birth, surgical site, and procedure verified Lesion destroyed using liquid nitrogen: Yes   Region frozen until ice ball extended beyond lesion: Yes   Outcome: patient tolerated procedure well with no complications   Post-procedure details: wound care instructions given   Skin cancer screening performed today.  Actinic Damage - Chronic condition, secondary to cumulative UV/sun exposure - diffuse scaly erythematous macules with underlying dyspigmentation - Recommend daily broad spectrum sunscreen SPF 30+ to sun-exposed areas, reapply every 2 hours as needed.  - Staying in the shade or wearing long sleeves, sun glasses (UVA+UVB protection) and wide brim hats (4-inch brim around the entire circumference of the hat) are also recommended for sun protection.  - Call for new or changing lesions.  Lentigines, Seborrheic Keratoses, Hemangiomas - Benign normal skin lesions - Benign-appearing - Call for any changes  Melanocytic Nevi - Tan-brown and/or pink-flesh-colored symmetric macules and papules - Benign appearing on exam today - Observation - Call clinic for new or changing moles - Recommend daily use of broad spectrum spf 30+ sunscreen to sun-exposed areas.   HISTORY OF BASAL CELL CARCINOMA OF THE SKIN - No evidence of recurrence today - Recommend regular full body skin exams - Recommend daily broad spectrum sunscreen SPF 30+ to sun-exposed areas, reapply every 2 hours as needed.  - Call if any new or changing lesions are noted between office visits   Return in about 6 months (around 08/23/2024) for Aks .  IAngelique Holm, CMA, am acting as scribe for Armida Sans, MD .   Documentation: I have reviewed the above documentation for accuracy and  completeness, and I agree with the above.  Armida Sans, MD

## 2024-02-21 NOTE — Patient Instructions (Addendum)
 5-Fluorouracil/Calcipotriene Patient Education  Around April 17 begin 5FU/Calcipotriene cream apply to scalp twice a day x 10 days  Actinic keratoses are the dry, red scaly spots on the skin caused by sun damage. A portion of these spots can turn into skin cancer with time, and treating them can help prevent development of skin cancer.   Treatment of these spots requires removal of the defective skin cells. There are various ways to remove actinic keratoses, including freezing with liquid nitrogen, treatment with creams, or treatment with a blue light procedure in the office.   5-fluorouracil cream is a topical cream used to treat actinic keratoses. It works by interfering with the growth of abnormal fast-growing skin cells, such as actinic keratoses. These cells peel off and are replaced by healthy ones.   5-fluorouracil/calcipotriene is a combination of the 5-fluorouracil cream with a vitamin D analog cream called calcipotriene. The calcipotriene alone does not treat actinic keratoses. However, when it is combined with 5-fluorouracil, it helps the 5-fluorouracil treat the actinic keratoses much faster so that the same results can be achieved with a much shorter treatment time.  INSTRUCTIONS FOR 5-FLUOROURACIL/CALCIPOTRIENE CREAM:   5-fluorouracil/calcipotriene cream typically only needs to be used for 4-7 days. A thin layer should be applied twice a day to the treatment areas recommended by your physician.   If your physician prescribed you separate tubes of 5-fluourouracil and calcipotriene, apply a thin layer of 5-fluorouracil followed by a thin layer of calcipotriene.   Avoid contact with your eyes, nostrils, and mouth. Do not use 5-fluorouracil/calcipotriene cream on infected or open wounds.   You will develop redness, irritation and some crusting at areas where you have pre-cancer damage/actinic keratoses. IF YOU DEVELOP PAIN, BLEEDING, OR SIGNIFICANT CRUSTING, STOP THE TREATMENT EARLY - you  have already gotten a good response and the actinic keratoses should clear up well.  Wash your hands after applying 5-fluorouracil 5% cream on your skin.   A moisturizer or sunscreen with a minimum SPF 30 should be applied each morning.   Once you have finished the treatment, you can apply a thin layer of Vaseline twice a day to irritated areas to soothe and calm the areas more quickly. If you experience significant discomfort, contact your physician.  For some patients it is necessary to repeat the treatment for best results.  SIDE EFFECTS: When using 5-fluorouracil/calcipotriene cream, you may have mild irritation, such as redness, dryness, swelling, or a mild burning sensation. This usually resolves within 2 weeks. The more actinic keratoses you have, the more redness and inflammation you can expect during treatment. Eye irritation has been reported rarely. If this occurs, please let us know.  If you have any trouble using this cream, please call the office. If you have any other questions about this information, please do not hesitate to ask me before you leave the office.       Due to recent changes in healthcare laws, you may see results of your pathology and/or laboratory studies on MyChart before the doctors have had a chance to review them. We understand that in some cases there may be results that are confusing or concerning to you. Please understand that not all results are received at the same time and often the doctors may need to interpret multiple results in order to provide you with the best plan of care or course of treatment. Therefore, we ask that you please give [place] 2 business days to thoroughly review all your results before contacting the  office for clarification. Should we see a critical lab result, you will be contacted sooner.   If You Need Anything After Your Visit  If you have any questions or concerns for your doctor, please call our main line at [phone] and  press option 4 to reach your doctor's medical assistant. If no one answers, please leave a voicemail as directed and we will return your call as soon as possible. Messages left after 4 pm will be answered the following business day.   You may also send [place] a message via MyChart. We typically respond to MyChart messages within 1-2 business days.  For prescription refills, please ask your pharmacy to contact our office. Our fax number is [phone].  If you have an urgent issue when the clinic is closed that cannot wait until the next business day, you can page your doctor at the number below.    Please note that while we do our best to be available for urgent issues outside of office hours, we are not available 24/7.   If you have an urgent issue and are unable to reach [place], you may choose to seek medical care at your doctor's office, retail clinic, urgent care center, or emergency room.  If you have a medical emergency, please immediately call 911 or go to the emergency department.  Pager Numbers  - Dr. Gwen Pounds: [phone]  - Dr. Roseanne Reno: [phone]  - Dr. Katrinka Blazing: [phone]   In the event of inclement weather, please call our main line at [phone] for an update on the status of any delays or closures.  Dermatology Medication Tips: Please keep the boxes that topical medications come in in order to help keep track of the instructions about where and how to use these. Pharmacies typically print the medication instructions only on the boxes and not directly on the medication tubes.   If your medication is too expensive, please contact our office at [phone] option 4 or send [place] a message through MyChart.   We are unable to tell what your co-pay for medications will be in advance as this is different depending on your insurance coverage. However, we may be able to find a substitute medication at lower cost or fill out paperwork to get insurance to cover a needed medication.    If a prior authorization is required to get your medication covered by your insurance company, please allow [place] 1-2 business days to complete this process.  Drug prices often vary depending on where the prescription is filled and some pharmacies may offer cheaper prices.  The website www.goodrx.com contains coupons for medications through different pharmacies. The prices here do not account for what the cost may be with help from insurance (it may be cheaper with your insurance), but the website can give you the price if you did not use any insurance.  - You can print the associated coupon and take it with your prescription to the pharmacy.  - You may also stop by our office during regular business hours and pick up a GoodRx coupon card.  - If you need your prescription sent electronically to a different pharmacy, notify our office through [facility] or by phone at [phone] option 4.     Si Usted Necesita Algo Despus de Su Visita  Tambin puede enviarnos un mensaje a travs de Clinical cytogeneticist. Por lo general respondemos a los mensajes de MyChart en el transcurso de 1 a 2 das hbiles.  Para renovar recetas, por favor pida a su  farmacia que se ponga en contacto con nuestra oficina. Annie Sable de fax es [place] [phone].  Si tiene un asunto urgente cuando la clnica est cerrada y que no puede esperar hasta el siguiente da hbil, puede llamar/localizar a su doctor(a) al nmero que aparece a continuacin.   Por favor, tenga en cuenta que aunque hacemos todo lo posible para estar disponibles para asuntos urgentes fuera del horario de [place], no estamos disponibles las 24 horas del da, los 7 [address]  [address] de la [place].   Si tiene un problema urgente y no puede comunicarse con nosotros, puede optar por buscar atencin mdica  en el consultorio de su doctor(a), en una clnica privada, en un centro de atencin urgente o en una sala de emergencias.  Si tiene Engineer, drilling, por favor  llame inmediatamente al 911 o vaya a la sala de emergencias.  Nmeros de bper  - Dr. Gwen Pounds: [phone]  - Dra. Roseanne Reno: [phone]  - Dr. Katrinka Blazing: [phone]   En caso de inclemencias del tiempo, por favor llame a Lacy Duverney principal al [phone] para una actualizacin sobre el [place] de cualquier retraso o cierre.  Consejos para la medicacin en dermatologa: Por favor, guarde las cajas en las que vienen los medicamentos de uso tpico para ayudarle a seguir las instrucciones sobre dnde y cmo usarlos. Las farmacias generalmente imprimen las instrucciones del medicamento slo en las cajas y no directamente en los tubos del [place].   Si su medicamento es muy caro, por favor, pngase en contacto con Rolm Gala llamando al [phone] y presione la opcin 4 o envenos un mensaje a travs de Clinical cytogeneticist.   No podemos decirle cul ser su copago por los medicamentos por adelantado ya que esto es diferente dependiendo de la cobertura de su seguro. Sin embargo, es posible que podamos encontrar un medicamento sustituto a Audiological scientist un formulario para que el seguro cubra el medicamento que se considera necesario.   Si se requiere una autorizacin previa para que su compaa de seguros [place] su medicamento, por favor permtanos de 1 a 2 das hbiles para completar [address]  Los precios de los medicamentos varan con frecuencia dependiendo del Environmental consultant de dnde se surte la receta y alguna farmacias pueden ofrecer precios ms baratos.  El sitio web www.goodrx.com tiene cupones para medicamentos de Health and safety inspector. Los precios aqu no tienen en cuenta lo que podra costar con la ayuda del seguro (puede ser ms barato con su seguro), pero el sitio web puede darle el precio si no utiliz Tourist information centre manager.  - Puede imprimir el cupn correspondiente y llevarlo con su receta a la farmacia.  - Tambin puede pasar por nuestra oficina durante el horario de atencin regular y  Education officer, museum una tarjeta de cupones de GoodRx.  - Si necesita que su receta se enve electrnicamente a una farmacia diferente, informe a nuestra oficina a travs de MyChart de [place] o por telfono llamando al [phone] y presione la opcin 4.

## 2024-04-06 ENCOUNTER — Other Ambulatory Visit (INDEPENDENT_AMBULATORY_CARE_PROVIDER_SITE_OTHER): Payer: Self-pay | Admitting: Vascular Surgery

## 2024-04-06 DIAGNOSIS — I7143 Infrarenal abdominal aortic aneurysm, without rupture: Secondary | ICD-10-CM

## 2024-04-16 NOTE — Progress Notes (Unsigned)
 MRN : [id number]  Cameron Norton is a 89 y.o. ([birth year]) male who presents with chief complaint of check circulation.  History of Present Illness:   The patient is seen for follow up evaluation of AAA status post CTA. There were no problems or complications related to the CT scan.    The patient denies interval development of abdominal or back pain. No new lower extremity pain or discoloration of the toes.    The patient denies interval anaurosis fugax. There is no recent history of TIA symptoms or focal motor deficits.    The patient denies PAD or claudication symptoms.    The patient denies recent episodes of angina or shortness of breath.   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  No significant change when compared to the duplex ultrasound of 6 months ago.  No outpatient medications have been marked as taking for the 04/17/24 encounter (Appointment) with Prescilla Brod, Ninette Basque, MD.    Past Medical History:  Diagnosis Date   Actinic keratosis 2017   BCC (basal cell carcinoma) 2014   left medial cheek, Mohs > 10 years ago with Dr. 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 Base involved.    No past surgical history on file.  Social History Social History   Tobacco Use   Smoking status: Never    Passive exposure: Never   Smokeless tobacco: Never  Substance Use Topics   Alcohol use: Never   Drug use: Never    Family History No family history on file.  No Known Allergies   REVIEW OF SYSTEMS (Negative unless checked)  Constitutional: [] Weight loss  [] Fever  [] Chills Cardiac: [] Chest pain   [] Chest pressure   [] Palpitations   [] Shortness of breath when laying flat   [] Shortness of breath with exertion. Vascular:  [x] Pain in legs with walking   [] Pain in legs  at rest  [] History of DVT   [] Phlebitis   [] Swelling in legs   [] Varicose veins   [] Non-healing ulcers Pulmonary:   [] Uses home oxygen   [] Productive cough   [] Hemoptysis   [] Wheeze  [] COPD   [] Asthma Neurologic:  [] Dizziness   [] Seizures   [] History of stroke   [] History of TIA  [] Aphasia   [] Vissual changes   [] Weakness or numbness in arm   [] Weakness or numbness in leg Musculoskeletal:   [] Joint swelling   [] Joint pain   [] Low back pain Hematologic:  [] Easy bruising  [] Easy bleeding   [] Hypercoagulable state   [] Anemic Gastrointestinal:  [] Diarrhea   [] Vomiting  [] Gastroesophageal reflux/heartburn   [] Difficulty swallowing. Genitourinary:  [] Chronic kidney disease   [] Difficult urination  [] Frequent urination   [] Blood in urine Skin:  [] Rashes   [] Ulcers  Psychological:  [] History of anxiety   []  History of major depression.  Physical Examination  There were no vitals filed for this visit. There is no height or weight on file to calculate BMI. Gen: WD/WN, NAD Head:  [place]/AT, No temporalis wasting.  Ear/Nose/Throat: Hearing grossly intact, nares w/o erythema or drainage Eyes: PER, EOMI, sclera nonicteric.  Neck: Supple, no masses.  No bruit or JVD.  Pulmonary:  Good air movement, no audible wheezing, no use of accessory muscles.  Cardiac: RRR, normal S1, S2, no Murmurs. Vascular:  mild trophic changes, no open wounds Vessel Right Left  Radial Palpable Palpable  PT Not Palpable Not Palpable  DP Not Palpable Not Palpable  Gastrointestinal: soft, non-distended. No guarding/no peritoneal signs.  Musculoskeletal: M/S 5/5 throughout.  No visible deformity.  Neurologic: CN 2-12 intact. Pain and light touch intact in extremities.  Symmetrical.  Speech is fluent. Motor exam as listed above. Psychiatric: Judgment intact, Mood & affect appropriate for pt's clinical situation. Dermatologic: No rashes or ulcers noted.  No changes consistent with cellulitis.   CBC No results found for: "WBC",  "HGB", "HCT", "MCV", "PLT"  BMET    Component Value Date/Time   CREATININE 0.80 10/05/2023 1007   CrCl cannot be calculated (Patient's most recent lab result is older than the maximum 21 days allowed.).  COAG No results found for: "INR", "PROTIME"  Radiology No results found.   Assessment/Plan There are no diagnoses linked to this encounter.   Devon Fogo, MD  04/16/2024 2:05 PM

## 2024-04-17 ENCOUNTER — Encounter (INDEPENDENT_AMBULATORY_CARE_PROVIDER_SITE_OTHER): Payer: Self-pay | Admitting: Vascular Surgery

## 2024-04-17 ENCOUNTER — Ambulatory Visit (INDEPENDENT_AMBULATORY_CARE_PROVIDER_SITE_OTHER): Payer: Medicare Other

## 2024-04-17 ENCOUNTER — Ambulatory Visit (INDEPENDENT_AMBULATORY_CARE_PROVIDER_SITE_OTHER): Payer: Medicare Other | Admitting: Vascular Surgery

## 2024-04-17 VITALS — BP 138/71 | HR 61 | Resp 17 | Ht 69.5 in | Wt 180.0 lb

## 2024-04-17 DIAGNOSIS — I10 Essential (primary) hypertension: Secondary | ICD-10-CM | POA: Diagnosis not present

## 2024-04-17 DIAGNOSIS — I70213 Atherosclerosis of native arteries of extremities with intermittent claudication, bilateral legs: Secondary | ICD-10-CM

## 2024-04-17 DIAGNOSIS — I25119 Atherosclerotic heart disease of native coronary artery with unspecified angina pectoris: Secondary | ICD-10-CM | POA: Diagnosis not present

## 2024-04-17 DIAGNOSIS — E782 Mixed hyperlipidemia: Secondary | ICD-10-CM

## 2024-04-17 DIAGNOSIS — I7143 Infrarenal abdominal aortic aneurysm, without rupture: Secondary | ICD-10-CM

## 2024-04-25 ENCOUNTER — Encounter (INDEPENDENT_AMBULATORY_CARE_PROVIDER_SITE_OTHER): Payer: Self-pay

## 2024-08-28 ENCOUNTER — Ambulatory Visit: Admitting: Dermatology

## 2024-08-28 ENCOUNTER — Encounter: Payer: Self-pay | Admitting: Dermatology

## 2024-08-28 DIAGNOSIS — L578 Other skin changes due to chronic exposure to nonionizing radiation: Secondary | ICD-10-CM

## 2024-08-28 DIAGNOSIS — W908XXA Exposure to other nonionizing radiation, initial encounter: Secondary | ICD-10-CM

## 2024-08-28 DIAGNOSIS — Z79899 Other long term (current) drug therapy: Secondary | ICD-10-CM

## 2024-08-28 DIAGNOSIS — Z7189 Other specified counseling: Secondary | ICD-10-CM

## 2024-08-28 DIAGNOSIS — L570 Actinic keratosis: Secondary | ICD-10-CM

## 2024-08-28 DIAGNOSIS — L820 Inflamed seborrheic keratosis: Secondary | ICD-10-CM

## 2024-08-28 NOTE — Patient Instructions (Addendum)
 Photodynamic Therapy- Blue or Red Light Therapy  Actinic keratoses are the dry, red scaly spots on the skin caused by sun damage. A portion of these spots can turn into skin cancer with time, and treating them can help prevent development of skin cancer.   Treatment of these spots requires removal of the defective skin cells. There are various ways to remove actinic keratoses, including freezing with liquid nitrogen, treatment with creams, or treatment with a blue light procedure in the office.   Photodynamic Therapy (PDT), also known as blue or red light therapy is an in office procedure used to treat actinic keratoses. It works by targeting precancerous cells. After treatment, these cells peel off and are replaced by healthy ones.   For your phototherapy appointment, you will have two appointments on the day of your treatment. The first appointment will be to apply a cream to the treatment area. You will leave this cream on for 1-2 hours depending on the area being treated. The second appointment will be to shine a blue or red light on the area for 16-20 minutes to kill off the precancer cells. It is common to experience a burning sensation during the treatment.  After your treatment, it will be important to keep the treated areas of skin out of the sun completely for 48-72 hours (2-3 days) to prevent having a reaction.   Common side effects include: - Burning or stinging, which may be severe and can last up to 24-72 hours after your treatment - Scaling and crusting which may last up to 2 weeks - Redness, swelling and/or peeling which can last up to 4 weeks  To Care for Your Skin After PDT/Blue/Red Light Therapy: - Wash with soap, water and shampoo as normal. - If needed, you can use cold compresses (e.g. ice packs) for comfort - If okay with your primary care doctor, you may use analgesics such as acetaminophen (tylenol) every 4-6 hours, not to exceed recommended dose - You may apply  Cerave Healing Ointment, Vaseline or Aquaphor as needed - If you have a lot of swelling you may take a Benadryl to help with this (this may cause drowsiness), not to exceed recommended dose. This may increase the risk of falls in people over 65 and may slow reaction time while driving, so it is not recommended to take before driving or operating machinery. - Sun Precautions - Wear a wide brim hat for the next week if outside  - Wear a sunblock with zinc or titanium dioxide at least SPF 50 daily  If you have any questions or concerns, please call the office and ask to speak with a nurse.   --------------------------------------------------------------------------------------------------------------    Due to recent changes in healthcare laws, you may see results of your pathology and/or laboratory studies on MyChart before the doctors have had a chance to review them. We understand that in some cases there may be results that are confusing or concerning to you. Please understand that not all results are received at the same time and often the doctors may need to interpret multiple results in order to provide you with the best plan of care or course of treatment. Therefore, we ask that you please give us  2 business days to thoroughly review all your results before contacting the office for clarification. Should we see a critical lab result, you will be contacted sooner.   If You Need Anything After Your Visit  If you have any questions or concerns for your doctor,  please call our main line at [phone] and press option 4 to reach your doctor's medical assistant. If no one answers, please leave a voicemail as directed and we will return your call as soon as possible. Messages left after 4 pm will be answered the following business day.   You may also send us  a message via MyChart. We typically respond to MyChart messages within 1-2 business days.  For prescription refills, please ask your pharmacy  to contact our office. Our fax number is [phone].  If you have an urgent issue when the clinic is closed that cannot wait until the next business day, you can page your doctor at the number below.    Please note that while we do our best to be available for urgent issues outside of office hours, we are not available 24/7.   If you have an urgent issue and are unable to reach us , you may choose to seek medical care at your doctor's office, retail clinic, urgent care center, or emergency room.  If you have a medical emergency, please immediately call 911 or go to the emergency department.  Pager Numbers  - Dr. Hester: [phone]  - Dr. Jackquline: [phone]  - Dr. Claudene: [phone]   - Dr. Raymund: [phone]  In the event of inclement weather, please call our main line at [phone] for an update on the status of any delays or closures.  Dermatology Medication Tips: Please keep the boxes that topical medications come in in order to help keep track of the instructions about where and how to use these. Pharmacies typically print the medication instructions only on the boxes and not directly on the medication tubes.   If your medication is too expensive, please contact our office at [phone] option 4 or send us  a message through MyChart.   We are unable to tell what your co-pay for medications will be in advance as this is different depending on your insurance coverage. However, we may be able to find a substitute medication at lower cost or fill out paperwork to get insurance to cover a needed medication.   If a prior authorization is required to get your medication covered by your insurance company, please allow us  1-2 business days to complete this process.  Drug prices often vary depending on where the prescription is filled and some pharmacies may offer cheaper prices.  The website www.goodrx.com contains coupons for medications through different pharmacies. The  prices here do not account for what the cost may be with help from insurance (it may be cheaper with your insurance), but the website can give you the price if you did not use any insurance.  - You can print the associated coupon and take it with your prescription to the pharmacy.  - You may also stop by our office during regular business hours and pick up a GoodRx coupon card.  - If you need your prescription sent electronically to a different pharmacy, notify our office through [facility] or by phone at [phone] option 4.     Si Usted Necesita Algo Despus de Su Visita  Tambin puede enviarnos un mensaje a travs de Clinical cytogeneticist. Por lo general respondemos a los mensajes de MyChart en el transcurso de 1 a 2 das hbiles.  Para renovar recetas, por favor pida a su farmacia que se ponga en contacto con nuestra oficina. Randi lakes de fax es [place] [phone].  Si tiene un asunto urgente cuando la clnica est cerrada y que no  puede esperar hasta el siguiente da hbil, puede llamar/localizar a su doctor(a) al nmero que aparece a continuacin.   Por favor, tenga en cuenta que aunque hacemos todo lo posible para estar disponibles para asuntos urgentes fuera del horario de [place], no estamos disponibles las 24 horas del da, los 7 [address]  [address] de la [place].   Si tiene un problema urgente y no puede comunicarse con nosotros, puede optar por buscar atencin mdica  en el consultorio de su doctor(a), en una clnica privada, en un centro de atencin urgente o en una sala de emergencias.  Si tiene Engineer, drilling, por favor llame inmediatamente al 911 o vaya a la sala de emergencias.  Nmeros de bper  - Dr. Hester: [phone]  - Dra. Jackquline: [phone]  - Dr. Claudene: [phone]  - Dra. Kitts: [phone]  En caso de inclemencias del [place], por favor llame a nuestra lnea principal al [phone] para una actualizacin sobre el estado de cualquier retraso o  cierre.  Consejos para la medicacin en dermatologa: Por favor, guarde las cajas en las que vienen los medicamentos de uso tpico para ayudarle a seguir las instrucciones sobre dnde y cmo usarlos. Las farmacias generalmente imprimen las instrucciones del medicamento slo en las cajas y no directamente en los tubos del [place].   Si su medicamento es muy caro, por favor, pngase en contacto con landry rieger llamando al [phone] y presione la opcin 4 o envenos un mensaje a travs de Clinical cytogeneticist.   No podemos decirle cul ser su copago por los medicamentos por adelantado ya que esto es diferente dependiendo de la cobertura de su seguro. Sin embargo, es posible que podamos encontrar un medicamento sustituto a Audiological scientist un formulario para que el seguro cubra el medicamento que se considera necesario.   Si se requiere una autorizacin previa para que su compaa de seguros malta su medicamento, por favor permtanos de 1 a 2 das hbiles para completar este proceso.  Los precios de los medicamentos varan con frecuencia dependiendo del Environmental consultant de dnde se surte la receta y alguna farmacias pueden ofrecer precios ms baratos.  El sitio web www.goodrx.com tiene cupones para medicamentos de Health and safety inspector. Los precios aqu no tienen en cuenta lo que podra costar con la ayuda del seguro (puede ser ms barato con su seguro), pero el sitio web puede darle el precio si no utiliz Tourist information centre manager.  - Puede imprimir el cupn correspondiente y llevarlo con su receta a la farmacia.  - Tambin puede pasar por nuestra oficina durante el horario de atencin regular y Education officer, museum una tarjeta de cupones de GoodRx.  - Si necesita que su receta se enve electrnicamente a una farmacia diferente, informe a nuestra oficina a travs de MyChart de [place] o por telfono llamando al [phone] y presione la opcin 4.

## 2024-08-28 NOTE — Progress Notes (Signed)
 Follow-Up Visit   Subjective  Cameron Norton is a 89 y.o. male who presents for the following: AK 39m f/u, scalp, face, pt did use 5FU/Calcipotriene to scalp with mild reaction  The following portions of the chart were reviewed this encounter and updated as appropriate: medications, allergies, medical history  Review of Systems:  No other skin or systemic complaints except as noted in HPI or Assessment and Plan.  Objective  Well appearing patient in no apparent distress; mood and affect are within normal limits.  A focused examination was performed of the following areas: Face, scalp  Relevant exam findings are noted in the Assessment and Plan.  Scalp x 20, face x 5 (25) Pink scaly macules scalp, forehead x 5, arms/hands x 12 (17) Stuck on waxy paps with erythema  Assessment & Plan   AK (ACTINIC KERATOSIS) (25) Scalp x 20, face x 5 (25) Actinic keratoses are precancerous spots that appear secondary to cumulative UV radiation exposure/sun exposure over time. They are chronic with expected duration over 1 year. A portion of actinic keratoses will progress to squamous cell carcinoma of the skin. It is not possible to reliably predict which spots will progress to skin cancer and so treatment is recommended to prevent development of skin cancer.  Recommend daily broad spectrum sunscreen SPF 30+ to sun-exposed areas, reapply every 2 hours as needed.  Recommend staying in the shade or wearing long sleeves, sun glasses (UVA+UVB protection) and wide brim hats (4-inch brim around the entire circumference of the hat). Call for new or changing lesions. Destruction of lesion - Scalp x 20, face x 5 (25) Complexity: simple   Destruction method: cryotherapy   Informed consent: discussed and consent obtained   Timeout:  patient name, date of birth, surgical site, and procedure verified Lesion destroyed using liquid nitrogen: Yes   Region frozen until ice ball extended beyond lesion: Yes    Outcome: patient tolerated procedure well with no complications   Post-procedure details: wound care instructions given    INFLAMED SEBORRHEIC KERATOSIS (17) scalp, forehead x 5, arms/hands x 12 (17) Symptomatic, irritating, patient would like treated. Destruction of lesion - scalp, forehead x 5, arms/hands x 12 (17) Complexity: simple   Destruction method: cryotherapy   Informed consent: discussed and consent obtained   Timeout:  patient name, date of birth, surgical site, and procedure verified Lesion destroyed using liquid nitrogen: Yes   Region frozen until ice ball extended beyond lesion: Yes   Outcome: patient tolerated procedure well with no complications   Post-procedure details: wound care instructions given     ACTINIC DAMAGE WITH PRECANCEROUS ACTINIC KERATOSES Counseling for Topical Chemotherapy Management: Patient exhibits: - Severe, confluent actinic changes with pre-cancerous actinic keratoses that is secondary to cumulative UV radiation exposure over time - Condition that is severe; chronic, not at goal. - diffuse scaly erythematous macules and papules with underlying dyspigmentation - Discussed Prescription Field Treatment topical Chemotherapy for Severe, Chronic Confluent Actinic Changes with Pre-Cancerous Actinic Keratoses Field treatment involves treatment of an entire area of skin that has confluent Actinic Changes (Sun/ Ultraviolet light damage) and PreCancerous Actinic Keratoses by method of PhotoDynamic Therapy (PDT) and/or prescription Topical Chemotherapy agents such as 5-fluorouracil , 5-fluorouracil /calcipotriene, and/or imiquimod.  The purpose is to decrease the number of clinically evident and subclinical PreCancerous lesions to prevent progression to development of skin cancer by chemically destroying early precancer changes that may or may not be visible.  It has been shown to reduce the risk of  developing skin cancer in the treated area. As a result of  treatment, redness, scaling, crusting, and open sores may occur during treatment course. One or more than one of these methods may be used and may have to be used several times to control, suppress and eliminate the PreCancerous changes. Discussed treatment course, expected reaction, and possible side effects. - Recommend daily broad spectrum sunscreen SPF 30+ to sun-exposed areas, reapply every 2 hours as needed.  - Staying in the shade or wearing long sleeves, sun glasses (UVA+UVB protection) and wide brim hats (4-inch brim around the entire circumference of the hat) are also recommended. - Call for new or changing lesions.  - Recommend Red Light Therapy with debridement to scalp and face (2 appointments)  Return for Red light with debridement  x 2 appointments October and November, scalp and face, 75m AK f/u.  I, Grayce Saunas, RMA, am acting as scribe for Alm Rhyme, MD .   Documentation: I have reviewed the above documentation for accuracy and completeness, and I agree with the above.  Alm Rhyme, MD

## 2024-08-29 ENCOUNTER — Telehealth: Payer: Self-pay

## 2024-08-29 NOTE — Telephone Encounter (Signed)
 Patient stopped by the office today. He does not want to do redlight PDT at this time due to other health related issues.  He is asking to start back with the 5FU Cream to the scalp.  Okay to send in?

## 2024-08-30 NOTE — Telephone Encounter (Signed)
 [facility] RX sent in.

## 2024-10-02 ENCOUNTER — Encounter

## 2024-10-02 ENCOUNTER — Ambulatory Visit

## 2024-10-19 ENCOUNTER — Other Ambulatory Visit (INDEPENDENT_AMBULATORY_CARE_PROVIDER_SITE_OTHER): Payer: Self-pay | Admitting: Vascular Surgery

## 2024-10-19 DIAGNOSIS — I7143 Infrarenal abdominal aortic aneurysm, without rupture: Secondary | ICD-10-CM

## 2024-10-23 ENCOUNTER — Encounter (INDEPENDENT_AMBULATORY_CARE_PROVIDER_SITE_OTHER): Payer: Self-pay | Admitting: Vascular Surgery

## 2024-10-23 ENCOUNTER — Ambulatory Visit (INDEPENDENT_AMBULATORY_CARE_PROVIDER_SITE_OTHER): Admitting: Vascular Surgery

## 2024-10-23 ENCOUNTER — Ambulatory Visit (INDEPENDENT_AMBULATORY_CARE_PROVIDER_SITE_OTHER)

## 2024-10-23 VITALS — BP 142/82 | HR 71 | Resp 18 | Ht 70.0 in | Wt 180.8 lb

## 2024-10-23 DIAGNOSIS — I25119 Atherosclerotic heart disease of native coronary artery with unspecified angina pectoris: Secondary | ICD-10-CM

## 2024-10-23 DIAGNOSIS — I70213 Atherosclerosis of native arteries of extremities with intermittent claudication, bilateral legs: Secondary | ICD-10-CM | POA: Diagnosis not present

## 2024-10-23 DIAGNOSIS — I10 Essential (primary) hypertension: Secondary | ICD-10-CM | POA: Diagnosis not present

## 2024-10-23 DIAGNOSIS — I7143 Infrarenal abdominal aortic aneurysm, without rupture: Secondary | ICD-10-CM | POA: Diagnosis not present

## 2024-10-23 DIAGNOSIS — E782 Mixed hyperlipidemia: Secondary | ICD-10-CM

## 2024-10-23 NOTE — Progress Notes (Unsigned)
 MRN : [id number]  Cameron Norton is a 89 y.o. (11/01/1934) male who presents with chief complaint of check circulation.  History of Present Illness:   The patient is seen for follow up evaluation of AAA status post CTA. There were no problems or complications related to the CT scan.    The patient denies interval development of abdominal or back pain. No new lower extremity pain or discoloration of the toes.    The patient denies interval anaurosis fugax. There is no recent history of TIA symptoms or focal motor deficits.    The patient denies PAD or claudication symptoms.    The patient denies recent episodes of angina or shortness of breath.   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  No significant change when compared to the duplex ultrasound of 6 months ago.   Duplex ultrasound of the abdominal aorta obtained today demonstrates the infrarenal abdominal aorta measures 4.7 cm.  No significant change compared to the CT dated May, 2 2025.  Current Meds  Medication Sig   aspirin EC 81 MG tablet Take 81 mg by mouth daily. Swallow whole.   ezetimibe-simvastatin (VYTORIN) 10-20 MG tablet Take 1 tablet by mouth at bedtime.   fexofenadine (ALLEGRA) 180 MG tablet Take by mouth.   ipratropium (ATROVENT) 0.06 % nasal spray Place 2 sprays into both nostrils 3 (three) times daily.   meclizine (ANTIVERT) 12.5 MG tablet Take by mouth.   metoprolol tartrate (LOPRESSOR) 25 MG tablet Take by mouth.   nitroGLYCERIN (NITROSTAT) 0.4 MG SL tablet Place under the tongue.   pantoprazole (PROTONIX) 40 MG tablet Take 40 mg by mouth daily.    Past Medical History:  Diagnosis Date   Actinic keratosis 2017   BCC (basal cell carcinoma) 2014   left medial cheek, Mohs > 10 years ago with Dr. 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 Base involved.    History reviewed. No pertinent surgical history.  Social History Social History   Tobacco Use   Smoking status: Never    Passive exposure: Never   Smokeless tobacco: Never  Substance Use Topics   Alcohol use: Never   Drug use: Never    Family History History reviewed. No pertinent family history.  No Known Allergies   REVIEW OF SYSTEMS (Negative unless checked)  Constitutional: [] Weight loss  [] Fever  [] Chills Cardiac: [] Chest pain   [] Chest pressure   [] Palpitations   [] Shortness of breath when laying flat   [] Shortness of breath with exertion. Vascular:  [x] Pain in legs with walking   [] Pain in legs at rest  [] History of DVT   [] Phlebitis   [] Swelling in legs   [] Varicose veins   [] Non-healing ulcers Pulmonary:   [] Uses home oxygen   [] Productive cough   [] Hemoptysis   [] Wheeze  [] COPD   [] Asthma Neurologic:  [] Dizziness   [] Seizures   [] History of stroke   [] History of TIA  [] Aphasia   []   Vissual changes   [] Weakness or numbness in arm   [] Weakness or numbness in leg Musculoskeletal:   [] Joint swelling   [] Joint pain   [] Low back pain Hematologic:  [] Easy bruising  [] Easy bleeding   [] Hypercoagulable state   [] Anemic Gastrointestinal:  [] Diarrhea   [] Vomiting  [] Gastroesophageal reflux/heartburn   [] Difficulty swallowing. Genitourinary:  [] Chronic kidney disease   [] Difficult urination  [] Frequent urination   [] Blood in urine Skin:  [] Rashes   [] Ulcers  Psychological:  [] History of anxiety   []  History of major depression.  Physical Examination  Vitals:   10/23/24 0838  BP: (!) 142/82  Pulse: 71  Resp: 18  Weight: 180 lb 12.8 oz (82 kg)  Height: 5' 10 (1.778 m)   Body mass index is 25.94 kg/m. Gen: WD/WN, NAD Head: [place]/AT, No temporalis wasting.  Ear/Nose/Throat: Hearing grossly intact, nares w/o erythema or drainage Eyes: PER, EOMI, sclera nonicteric.  Neck: Supple, no masses.  No bruit or JVD.   Pulmonary:  Good air movement, no audible wheezing, no use of accessory muscles.  Cardiac: RRR, normal S1, S2, no Murmurs. Vascular:  mild trophic changes, no open wounds Vessel Right Left  Radial Palpable Palpable  PT Not Palpable Not Palpable  DP Not Palpable Not Palpable  Gastrointestinal: soft, non-distended. No guarding/no peritoneal signs.  Musculoskeletal: M/S 5/5 throughout.  No visible deformity.  Neurologic: CN 2-12 intact. Pain and light touch intact in extremities.  Symmetrical.  Speech is fluent. Motor exam as listed above. Psychiatric: Judgment intact, Mood & affect appropriate for pt's clinical situation. Dermatologic: No rashes or ulcers noted.  No changes consistent with cellulitis.   CBC No results found for: WBC, HGB, HCT, MCV, PLT  BMET    Component Value Date/Time   CREATININE 0.80 10/05/2023 1007   CrCl cannot be calculated (Patient's most recent lab result is older than the maximum 21 days allowed.).  COAG No results found for: INR, PROTIME  Radiology No results found.   Assessment/Plan 1. Infrarenal abdominal aortic aneurysm (AAA) without rupture (HCC) (Primary) Recommend:   No surgery or intervention is indicated at this time.   The patient has an asymptomatic abdominal aortic aneurysm that is greater than 4 cm but less than 5 cm in maximal diameter.     I have reviewed the natural history of abdominal aortic aneurysm and the small risk of rupture for aneurysm less than 5 cm in size.  However, as these small aneurysms tend to enlarge over time, continued surveillance with ultrasound or CT scan is mandatory.    I have also discussed optimizing medical management with hypertension and lipid control and the negative effect that any tobacco products have on aneurysmal disease.  The patient is also encouraged to exercise a minimum of 30 minutes 4 times a week.    Should the patient develop new onset abdominal or back pain or signs of  peripheral embolization they are instructed to seek medical attention immediately and to alert the physician providing care that they have an aneurysm.    The patient voices their understanding.   I have scheduled the patient to return in 6 months with an aortic duplex. - VAS US  AORTA/IVC/ILIACS; Future   2. Atherosclerosis of native artery of both lower extremities with intermittent claudication (HCC)  Recommend:   The patient has evidence of atherosclerosis of the lower extremities with claudication.  The patient does not voice lifestyle limiting changes at this point in time.   Noninvasive studies do not suggest clinically  significant change.   No invasive studies, angiography or surgery at this time The patient should continue walking and begin a more formal exercise program.  The patient should continue antiplatelet therapy and aggressive treatment of the lipid abnormalities   No changes in the patient's medications at this time   Continued surveillance is indicated as atherosclerosis is likely to progress with time.     The patient will continue follow up with noninvasive studies as ordered.    3. Coronary artery disease involving native coronary artery of native heart with angina pectoris (HCC) Continue cardiac and antihypertensive medications as already ordered and reviewed, no changes at this time.   Continue statin as ordered and reviewed, no changes at this time   Nitrates PRN for chest pain   4. Essential hypertension Continue antihypertensive medications as already ordered, these medications have been reviewed and there are no changes at this time.   5. Mixed hyperlipidemia Continue statin as ordered and reviewed, no changes at this time     Cordella Shawl, MD  10/23/2024 8:50 AM

## 2024-10-25 ENCOUNTER — Encounter (INDEPENDENT_AMBULATORY_CARE_PROVIDER_SITE_OTHER): Payer: Self-pay | Admitting: Vascular Surgery

## 2024-10-31 ENCOUNTER — Encounter

## 2024-10-31 ENCOUNTER — Ambulatory Visit

## 2025-02-26 ENCOUNTER — Ambulatory Visit: Admitting: Dermatology

## 2025-04-23 ENCOUNTER — Ambulatory Visit (INDEPENDENT_AMBULATORY_CARE_PROVIDER_SITE_OTHER): Admitting: Vascular Surgery

## 2025-04-23 ENCOUNTER — Encounter (INDEPENDENT_AMBULATORY_CARE_PROVIDER_SITE_OTHER)
